# Patient Record
Sex: Female | Born: 1935 | Race: White | Hispanic: No | Marital: Single | Smoking: Never smoker
Health system: Southern US, Community
[De-identification: ages and names within clinical notes are randomized; demographics above are authoritative.]

## PROBLEM LIST (undated history)

## (undated) DIAGNOSIS — R7989 Other specified abnormal findings of blood chemistry: Secondary | ICD-10-CM

## (undated) DIAGNOSIS — W19XXXA Unspecified fall, initial encounter: Secondary | ICD-10-CM

## (undated) DIAGNOSIS — I10 Essential (primary) hypertension: Secondary | ICD-10-CM

## (undated) DIAGNOSIS — D649 Anemia, unspecified: Secondary | ICD-10-CM

## (undated) DIAGNOSIS — B029 Zoster without complications: Secondary | ICD-10-CM

## (undated) DIAGNOSIS — R279 Unspecified lack of coordination: Secondary | ICD-10-CM

## (undated) DIAGNOSIS — R296 Repeated falls: Secondary | ICD-10-CM

## (undated) DIAGNOSIS — Z933 Colostomy status: Secondary | ICD-10-CM

## (undated) DIAGNOSIS — I4891 Unspecified atrial fibrillation: Secondary | ICD-10-CM

## (undated) DIAGNOSIS — K439 Ventral hernia without obstruction or gangrene: Secondary | ICD-10-CM

## (undated) DIAGNOSIS — L0390 Cellulitis, unspecified: Secondary | ICD-10-CM

## (undated) DIAGNOSIS — M6281 Muscle weakness (generalized): Secondary | ICD-10-CM

## (undated) DIAGNOSIS — R1310 Dysphagia, unspecified: Secondary | ICD-10-CM

## (undated) HISTORY — DX: Zoster without complications: B02.9

## (undated) HISTORY — PX: TOTAL ABDOMINAL HYSTERECTOMY: SHX209

## (undated) HISTORY — PX: COLOSTOMY: SHX63

## (undated) HISTORY — DX: Colostomy status: Z93.3

---

## 1998-02-22 ENCOUNTER — Encounter: Payer: Self-pay | Admitting: Emergency Medicine

## 1998-02-22 ENCOUNTER — Emergency Department (HOSPITAL_COMMUNITY): Admission: EM | Admit: 1998-02-22 | Discharge: 1998-02-22 | Payer: Self-pay | Admitting: Emergency Medicine

## 2006-01-01 DIAGNOSIS — Z933 Colostomy status: Secondary | ICD-10-CM

## 2006-01-01 HISTORY — DX: Colostomy status: Z93.3

## 2006-08-30 ENCOUNTER — Ambulatory Visit: Payer: Self-pay | Admitting: Oncology

## 2006-08-30 ENCOUNTER — Inpatient Hospital Stay (HOSPITAL_COMMUNITY): Admission: EM | Admit: 2006-08-30 | Discharge: 2006-09-09 | Payer: Self-pay | Admitting: Emergency Medicine

## 2006-08-30 ENCOUNTER — Ambulatory Visit: Payer: Self-pay | Admitting: Surgery

## 2006-08-31 ENCOUNTER — Encounter (INDEPENDENT_AMBULATORY_CARE_PROVIDER_SITE_OTHER): Payer: Self-pay | Admitting: General Surgery

## 2006-09-13 ENCOUNTER — Ambulatory Visit: Payer: Self-pay | Admitting: Oncology

## 2008-03-04 ENCOUNTER — Emergency Department (HOSPITAL_COMMUNITY): Admission: EM | Admit: 2008-03-04 | Discharge: 2008-03-04 | Payer: Self-pay | Admitting: Emergency Medicine

## 2008-09-25 IMAGING — CR DG ABDOMEN ACUTE W/ 1V CHEST
3 series · 3 of 3 positions shown · non-contrast
Comparison: CT 08/29/06.

CLINICAL DATA: Abdominal pain for two days.
 ACUTE ABDOMINAL SERIES WITH CHEST ? 3 VIEWS ? 08/30/06:

[w chest pa]
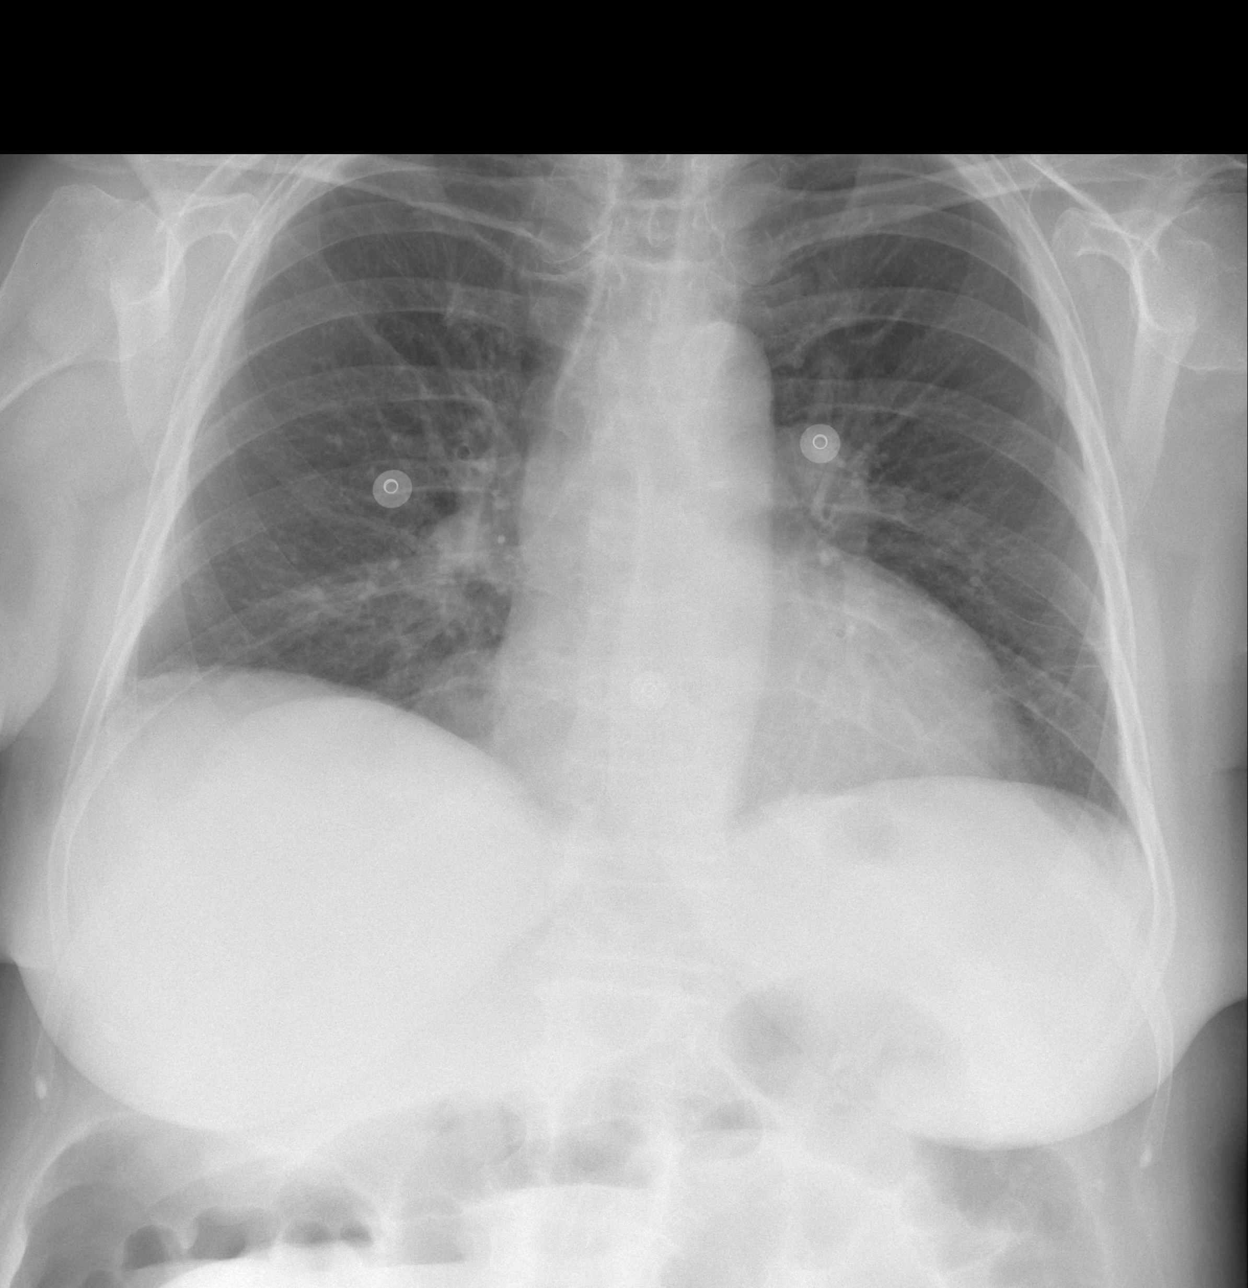

[w abdomen upright *]
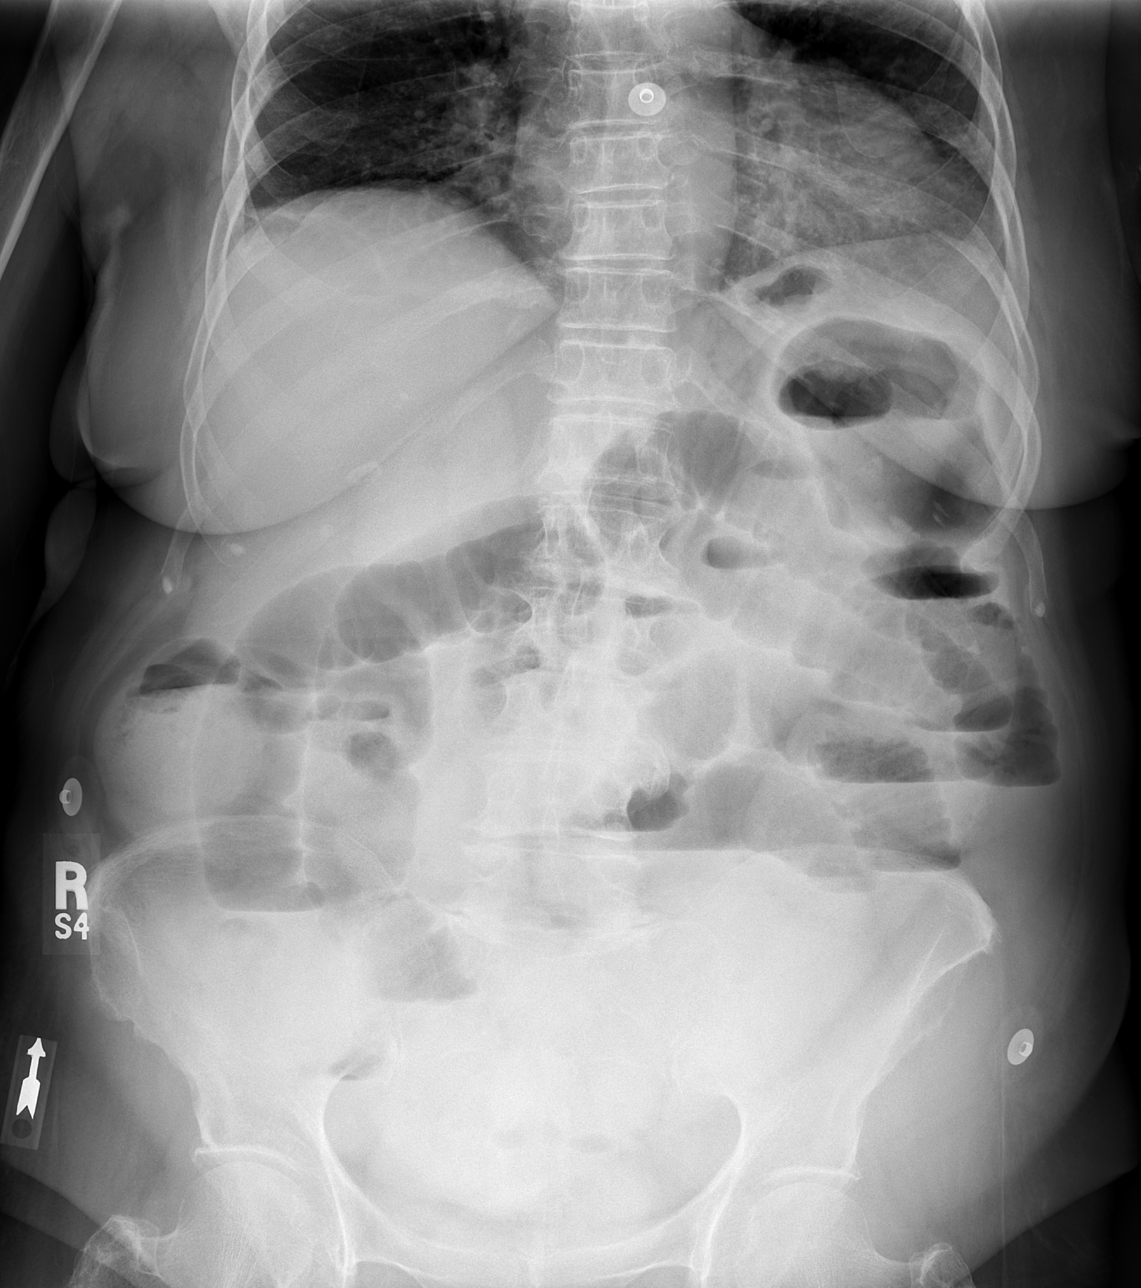

[t abdomen supine]
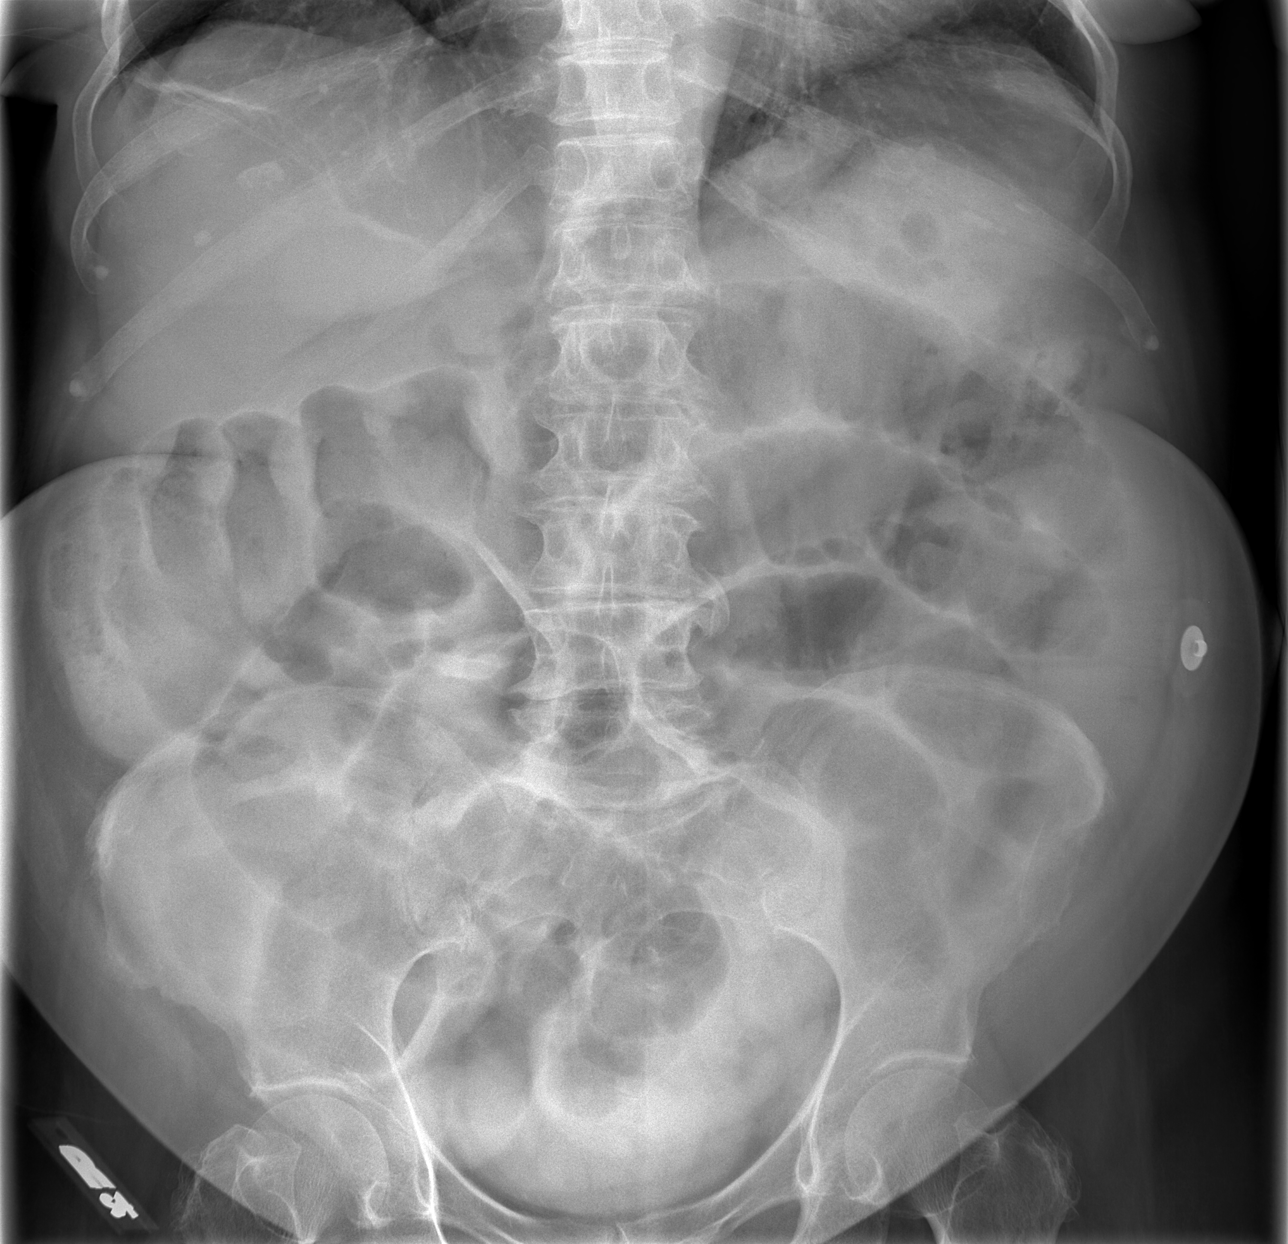

[3 of 3 positions shown; findings below may reference images not displayed]

FINDINGS: The cardiopericardial silhouette is within normal limits for size.   There is minimal bibasilar atelectasis, right worst than left.  Supine and upright views of the abdomen demonstrate multiple air-fluid levels within dilated large and small bowel compatible with a distal colonic obstruction seen on CT.
IMPRESSION: 1.  Distal colonic obstruction redemonstrated.
 2.  Low lung volumes with bibasilar atelectasis.

## 2008-09-26 IMAGING — CR DG BE SINGLE CONTRAST
4 series · 4 of 4 positions shown · non-contrast
Comparison: Radiographs 08/31/06 and earlier.

CLINICAL DATA: 77 year-old female with distal colonic obstruction, question of obstructing mass on prior CT.
WATER-SOLUBLE SINGLE-CONTRAST ENEMA:

[view not recorded (1 of 4)]
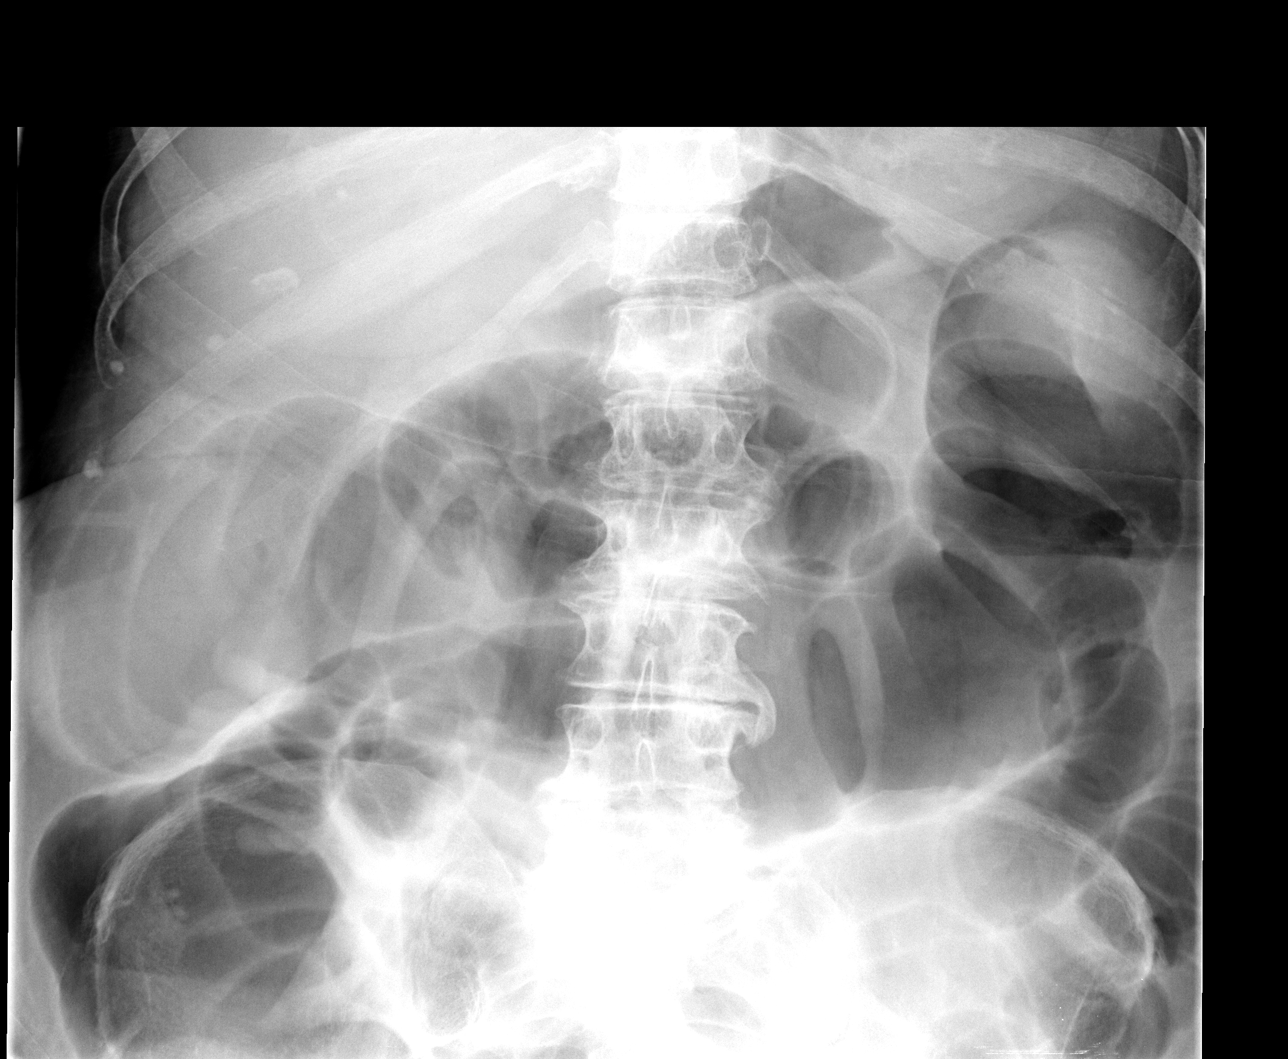

[view not recorded (2 of 4)]
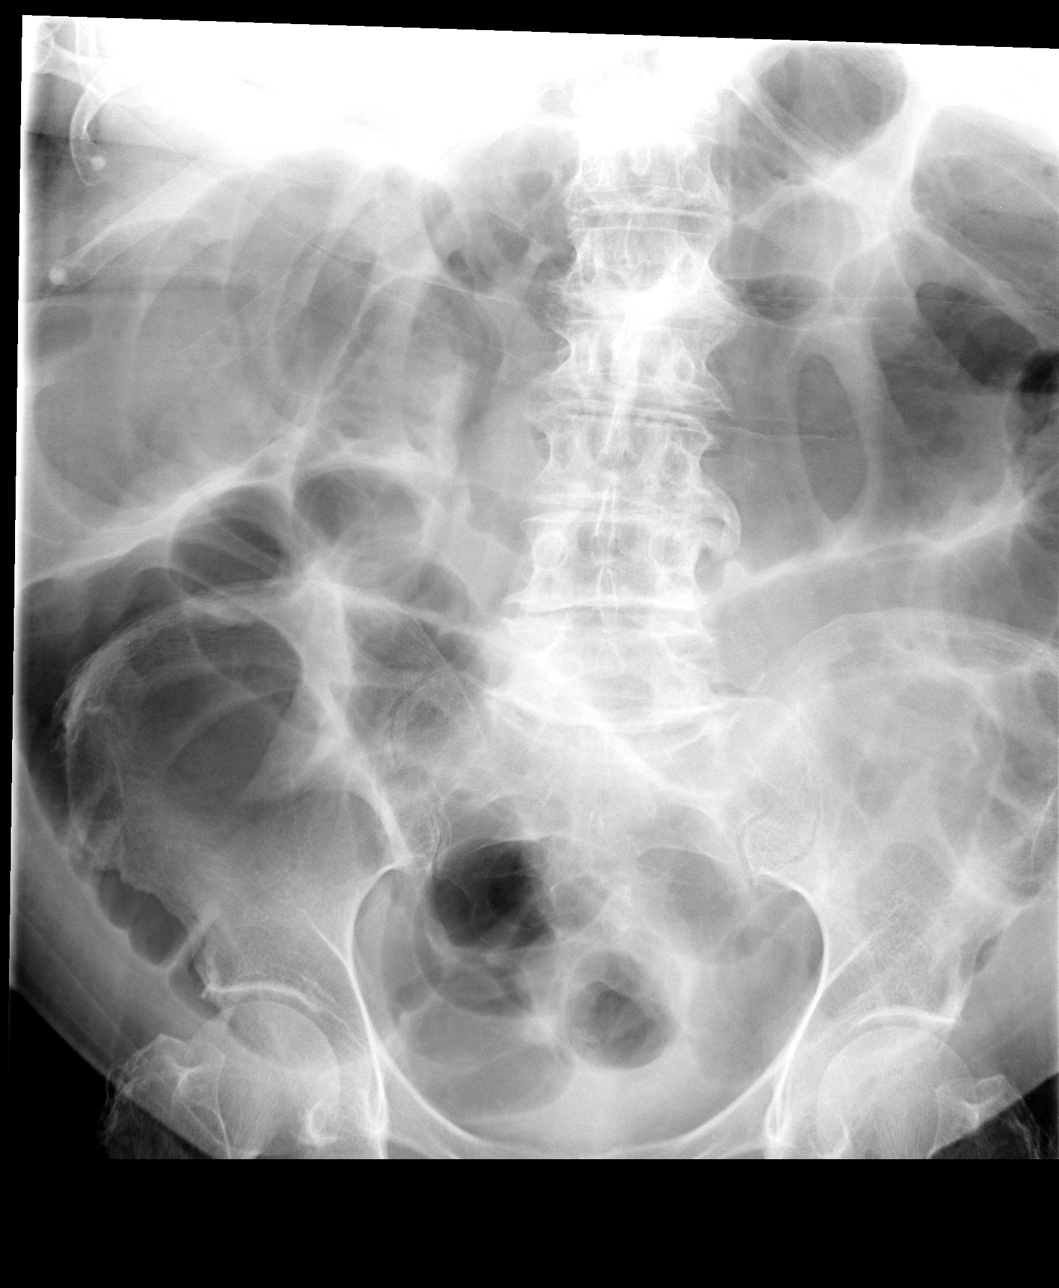

[view not recorded (3 of 4)]
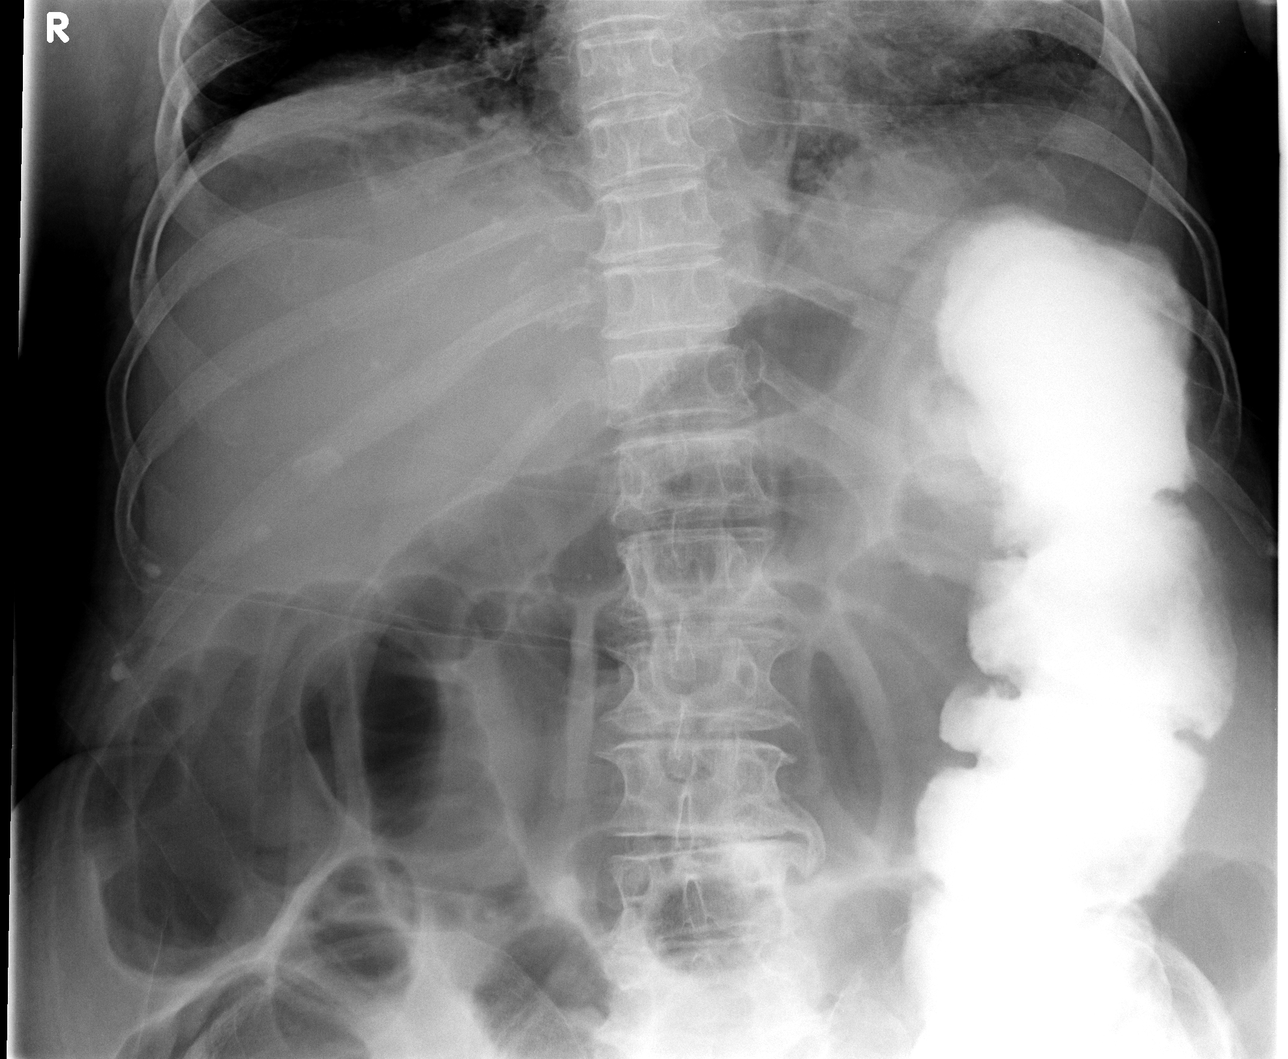

[view not recorded (4 of 4)]
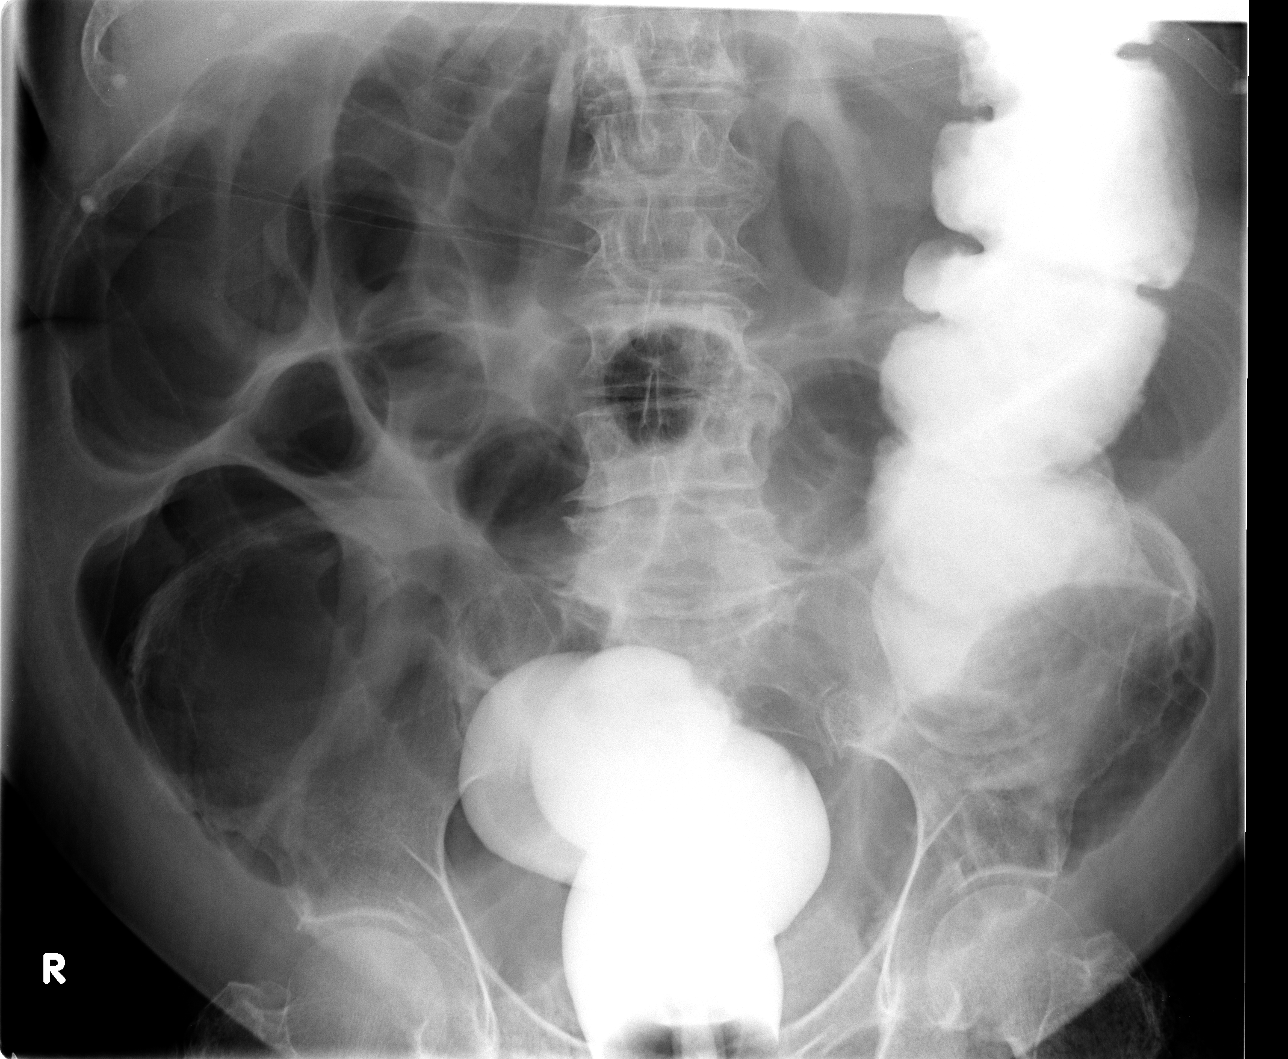

[4 of 4 positions shown; findings below may reference images not displayed]

CT abdomen and pelvis 08/29/06.
Technique/Findings:  The examination was explained to the patient.  Following unremarkable digital rectal examination, a flexible plastic catheter was inserted into the rectum and the balloon was inflated under fluoroscopic visualization.  Using gravity, water-soluble Gastrografin contrast was slowly administered to the colon during fluoroscopic and spot film evaluation.  
  The rectum was normal.  The distal sigmoid colon was normal.  At the junction of the sigmoid and descending colons overlying the left lower pelvis a radiographic string-sign was encountered in an area that was not distensible throughout the course of the exam.  Contrast was able to get past the lesion, and did normally distend the descending colon to the level of the splenic flexure.  Multiple fluoroscopic spot films were obtained of the area demonstrating shouldering of the adjacent descending and sigmoid segments and radiographic string-sign over a length of approximately 4cm.  This corresponds to the area of in-distensibility seen on the prior CT (series 2, image # 71).  At this point, the study was halted.  
  Several overhead films were taken and the contrast was drained from the colon.  Fluoroscopic postdrainage images were obtained showing normal drainage of the rectosigmoid segment.  There was contrast retained within the descending colon to the splenic flexure with slight drainage.
IMPRESSION: Findings compatible with apple-core lesion at the junctions of the descending and sigmoid colons with near complete obstruction along a 4cm segment correlating to prior CT findings.  Gm. Raion Lock and Hp Boyko were advised of the above by me at 2262 hours.

## 2010-01-22 ENCOUNTER — Encounter: Payer: Self-pay | Admitting: General Surgery

## 2010-04-13 LAB — DIFFERENTIAL
Basophils Absolute: 0 10*3/uL (ref 0.0–0.1)
Eosinophils Absolute: 0.1 10*3/uL (ref 0.0–0.7)
Lymphocytes Relative: 47 % — ABNORMAL HIGH (ref 12–46)
Monocytes Relative: 27 % — ABNORMAL HIGH (ref 3–12)
Neutrophils Relative %: 22 % — ABNORMAL LOW (ref 43–77)

## 2010-04-13 LAB — COMPREHENSIVE METABOLIC PANEL
Albumin: 3.8 g/dL (ref 3.5–5.2)
Alkaline Phosphatase: 69 U/L (ref 39–117)
BUN: 13 mg/dL (ref 6–23)
Chloride: 96 mEq/L (ref 96–112)
Creatinine, Ser: 0.67 mg/dL (ref 0.4–1.2)
Glucose, Bld: 98 mg/dL (ref 70–99)
Potassium: 3.7 mEq/L (ref 3.5–5.1)
Total Bilirubin: 1 mg/dL (ref 0.3–1.2)

## 2010-04-13 LAB — URINALYSIS, ROUTINE W REFLEX MICROSCOPIC
Bilirubin Urine: NEGATIVE
Ketones, ur: 15 mg/dL — AB
Nitrite: NEGATIVE
Protein, ur: NEGATIVE mg/dL
Urobilinogen, UA: 0.2 mg/dL (ref 0.0–1.0)

## 2010-04-13 LAB — PATHOLOGIST SMEAR REVIEW

## 2010-04-13 LAB — CBC
HCT: 35.2 % — ABNORMAL LOW (ref 36.0–46.0)
Hemoglobin: 12.4 g/dL (ref 12.0–15.0)
MCV: 97.9 fL (ref 78.0–100.0)
Platelets: 188 10*3/uL (ref 150–400)
RDW: 13.2 % (ref 11.5–15.5)

## 2010-04-13 LAB — LIPASE, BLOOD: Lipase: 22 U/L (ref 11–59)

## 2010-05-16 NOTE — H&P (Signed)
[name], [name]             ACCOUNT NO.:  [id number]   MEDICAL RECORD NO.:  [id number]          PATIENT TYPE:  INP   LOCATION:  1401                         FACILITY:  [facility]   PHYSICIAN:  Hind I Elsaid, MD      DATE OF BIRTH:  07/04/1928   DATE OF ADMISSION:  08/29/2006  DATE OF DISCHARGE:                              HISTORY & PHYSICAL   PRIMARY CARE PHYSICIAN:  Unassigned.   CHIEF COMPLAINT:  Abdominal pain and bilateral lower extremity swelling.   HISTORY OF PRESENT ILLNESS:  This is a 77 year old pleasant, Caucasian  female.  Apparently, she has no primary care physician and she has never  seen a primary care physician for a medical check up or followup as she  does not like doctors and does not like being hospitalized.  Apparently,  the patient had some abscess on her face in March 2008 when the patient  was receiving Clindamycin p.o. for that, which was complicated by  diarrhea, which resolved after stopping the Clindamycin after the  resolution of the facial abscess.  The patient continued to complain of  right iliac abdominal pain for more than 6 months from now.  The pain  severe, around 8/10, radiating around to the umbilicus which was thought  related to Clindamycin.  The condition was associated with constipation.  The patient denies any blood per rectum.  The patient admitted the pain  is on and off and the pain is not related to food.  Last bowel movement  was 3 days ago.  Denies any nausea or vomiting.  She has had about a 16  pound weight loss over the last 3 months.  The patient denies any  history of colonoscopy or endoscopy in the past.  As I mentioned, the  patient denies any nausea or vomiting and she did not complain of any  abdominal pain today.  She noticed bilateral lower extremity swelling in  the last 2 days.  The patient also admitted she complained of abdominal  fullness and swelling as if she is pregnant.  The patient denies any  headache, denies  any shortness of breath, denies any chest pain, denies  any palpitation, denies any hematuria and denies any vomiting of blood,  denies any fever or cough.  She denies any numbness or weakness of her  extremities.   PAST MEDICAL HISTORY:  The patient admitted she was diagnosed with some  leukopenia which she has followed by herself.  No further medical  history known to the patient.   PAST SURGICAL HISTORY:  Hysterectomy and some small facial abscess which  was resolved after Clindamycin use.   ALLERGIES:  ERYTHROMYCIN and PENICILLIN.   MEDICATIONS:  None.   FAMILY HISTORY:  Mother died with stroke and father died with heart  attack and both had diabetes.   SOCIAL HISTORY:  She is single.  No children.  Lives by herself.  No  history of smoking and occasionally drinks beer but she is not a heavy  drinker.  Denies any drug abuse.   PHYSICAL EXAMINATION:  GENERAL:  A really pleasant female, not  in any  acute respiratory distress or shortness of breath.  VITAL SIGNS:  Temperature 97.3, blood pressure 127/52, pulse rate 112,  respiratory rate 18, saturation 98% on room air.  HEENT:  Mildly pale, no jaundice.  Extraocular movements within normal.  Pupils equal, round, reactive to light and accommodation.  NECK:  No thyromegaly, no lymphadenopathy, no JVD.  LUNGS:  Normal regular breathing.  Equal air entry.  HEART:  S1 and S2, tachycardiac.  No other sounds.  ABDOMEN:  Distended.  Bowel sounds muffled.  Mild tenderness of the  right iliac area and I cannot appreciate any organomegaly at this time.  EXTREMITIES:  Bilateral lower edema.  NEURO:  Oriented x3.  There was no focal neurological finding.  RECTAL:  Stool was dark and guaiac positive.   LABORATORY DATA:  Sodium 132, chloride 4.7, carbon dioxide 28, glucose  121, BUN 20, creatinine 0.64, calcium 8.2, total protein 5.5, albumin  2.24, AST 14 and white blood cells 5, hemoglobin 9.4, hematocrit 27.2  with MCV 90 and MCHC 34.3,  platelets 494.  Urinalysis was negative  except for small ketones.  Urine RBCs were negative.  Abdominal x-ray  showed some dilated loops of the small bowel with air fluid level for  both of small bowel obstructions versus ileus.  CT scan which showed  colonic obstruction with concern for underlying lesion on the sigmoid  colon and large bowel diffuse colon  wall thickening.  No free air, 3 mm  right lung.   ASSESSMENT/PLAN:  1. Abdominal pain with above CT scan findings with main concern for      sigmoid colonic mass, possibility of colonic cancer.  We will admit      the patient.  Keep the patient NPO.  We will place an NG tube as      there is a possibility of large obstruction with complicated small      bowel obstruction secondary to the sigmoid colon mass.  We will      place on NG tube.  IV fluids.  We will consult gastroenterology      /surgicall consult to evaluate for possible colonoscopy and biopsy      of the sigmoid colon.  We will get carcinogenic embryonic antigen      and CA19 line.  We will consider CT of the chest and bone scan and      CT head after the report of the colonoscopy.  2. Anemia with guaiac positive stool most probably secondary to the      sigmoid colon mass.  There is no evidence of ischemic bowel at this      time.  We will monitor the hemoglobin and the patient may need a      blood transfusion during hospitalization.  DVT and rehab      prophylaxis.      Hind Bosie Helper, MD  Electronically Signed     HIE/MEDQ  D:  08/30/2006  T:  08/30/2006  Job:  [id number]

## 2010-05-16 NOTE — Op Note (Signed)
[name], [name]             ACCOUNT NO.:  [id number]   MEDICAL RECORD NO.:  [id number]          PATIENT TYPE:  INP   LOCATION:  1401                         FACILITY:  [facility]   PHYSICIAN:  Petra Kuba, M.D.    DATE OF BIRTH:  1928-08-31   DATE OF PROCEDURE:  08/31/2006  DATE OF DISCHARGE:                               OPERATIVE REPORT   PROCEDURE:  Colonoscopy.   INDICATION:  Abnormal CAT scan worrisome for mass versus colonic  obstruction of the sigmoid/descending junction.   Consent was signed after risks, benefits, methods, and options were  thoroughly discussed with the patient or any sedation on multiple  occasions.   MEDICINES USED:  Fentanyl 50 mcg, Versed 5 mg.   DESCRIPTION OF PROCEDURE:  Rectal inspection is pertinent for small  external hemorrhoids.  Digital exam was negative. The pediatric video  colonoscope was inserted and with lots of difficulty due to an unprepped  stool full with semisolid stool and liquid, we were able to advance to  about 50 cm.  We did try abdominal pressure and rolling her on her back  to try to advance further but we could not see in the distance.  We did  enter one dilated segment which air was suctioned from. Also on  insertion, occasional left-sided diverticula were seen and possibly we  even saw a small polyp; however, with the increased stool complete  visualization was difficult. No obvious mass was seen in the exam we  saw. Air and water were suctioned. Throughout the procedure, we washed  and suctioned 1500 mL but no definitive diagnosis could be made. After a  prolonged effort, we elected to withdraw.  Air was suctioned, scope  removed.  The patient tolerated the procedure well.  There was no  obvious immediate complication.   ENDOSCOPIC DIAGNOSES:  1. Internal and external hemorrhoids.  2. Occasional left-sided diverticula.  3. Questionably one small polyp seen in the sigmoid.  4. Otherwise increased __________  50 cm,  unable to wash and      suctioned, despite suctioning 1500 mL.   PLAN:  Will proceed with a Gastrografin enema and then pending those  findings either retry after a continual prep or possibly surgical  options. In the meantime, give her potassium and only ice chips,  possibly she will need an NG tube.           ______________________________  Petra Kuba, M.D.     MEM/MEDQ  D:  08/31/2006  T:  09/01/2006  Job:  [id number]   cc:   Alfonse Ras, MD  [address]  [place]  [place] [address]

## 2010-05-16 NOTE — Discharge Summary (Signed)
NAMEKRISTYNA, [name]             ACCOUNT NO.:  [id number]   MEDICAL RECORD NO.:  [id number]          PATIENT TYPE:  INP   LOCATION:  1401                         FACILITY:  [facility]   PHYSICIAN:  Lonia Blood, M.D.DATE OF BIRTH:  26-Feb-1928   DATE OF ADMISSION:  08/29/2006  DATE OF DISCHARGE:  09/09/2006                               DISCHARGE SUMMARY   PRIMARY CARE PHYSICIAN:  Unassigned.   DISCHARGE DIAGNOSES:  1. Pseudomyxoma peritonaei.      a.     Sigmoid colon location causing colon obstruction.      b.     Status post partial colectomy with ostomy.      c.     Outpatient Oncology follow up arranged.  2. Hyponatremia, resolved with p.o. intake.  3. Acute blood loss anemia - hemoglobin 8.3 at discharge.  4. Leukopenia - white count 1.8 to 2.2 at discharge - follow up with      Oncology.   ALLERGIES:  ERYTHROMYCIN AND PENICILLIN   DISCHARGE MEDICATIONS:  Vicodin 1-2 p.o. q.4 h p.r.n. pain.   FOLLOWUP:  1. The patient is to follow up with Dr. Claud Kelp in two weeks      for routine postop recheck.  2. The patient is to follow up with Dr. Arline Asp at the [facility]      in two weeks for ongoing medical care.   CONSULTATIONS:  1. Dr. Vida Rigger - GI.  2. General Surgery.  3. Dr. Johney Frame - Oncology.   PROCEDURES:  1. CT scan of the abdomen and pelvis  August 29, 2006. Distal colonic      obstruction, colon wall thickening with suspicious lesion in the      sigmoid colon.  No acute pelvic findings.  2. Colonoscopy August 31, 2006 - Significant retained stool and      occasional ticks, but otherwise, unrevealing.  3. Hartmann resection for obstructing sigmoid lesion with colostomy      August 31, 2006 - Dr. Claud Kelp.   DISCHARGE LABORATORIES:  Hemoglobin 8.3, white count 1.8, platelets 369.  Sodium 136, BUN 8, creatinine 0.7 on September 10, 2006.   Surgical pathology August 31, 2006.  Findings consistent with  pseudomyxoma peritonaei.   HOSPITAL COURSE:  [name] is a very pleasant 77 year old  female who has been remarkably healthy up to date.  She had no local  primary care physician.  She presented to the hospital on August 29, 2006, with complaints of abdominal pain and severe bilateral lower  extremity edema.  CT scan of the abdomen was obtained in the emergency  room and revealed evidence of a colonic obstruction, but no evidence of  a ruptured discus.  The patient was admitted to the acute units.  NG  tube was placed for decompression.  GI was consulted.  Attempt was made  to perform a colonoscopy, but in a nonprep state.  This was minimally  useful.  No suspicious lesions were able to be found.  As a result, a  barium enema was ordered.  This revealed apple-cord-type lesion at the  junction  of the descending and sigmoid colon with near complete  obstruction.  This was discussed with General Surgery who had also been  consulted on the case.  Dr. Claud Kelp then took the patient to the  operating room and performed a sigmoid colon resection/Hartmann  resection with a coloscopy.  In the postoperative period, the patient  improved nicely.  Surgical paths from the surgery suggested a  pseudomyxoma peritonaei.  Oncology was then consulted.  No additional  care was recommended by Oncology for the acute episode, but recurrence  of this lesion is felt to be virtually guaranteed, and therefore,  outpatient monitoring through the Oncology Center has been arranged by  Dr. Arline Asp.  Postoperative course was somewhat complicated by  hyponatremia and acute blood loss anemia.  The patient did not require  transfusion.  With volume resuscitation, her hyponatremia resolved.  The  patient was counseled as the appropriate care of her ostomy by wound  ostomy clinical nurse specialist.  By September 09, 2006, she was  medically stable.  Her hyponatremia had resolved.  Her hemoglobin was  relatively stable but will be  monitored in the outpatient setting.  Again, on September 09, 2006, both medically and surgical teams were in  agreement.  The patient was stable for discharge.  Home health, physical  therapy and occupational therapy was arranged, and the patient was clear  for discharge with follow up arrangements as noted above.      Lonia Blood, M.D.  Electronically Signed     JTM/MEDQ  D:  09/09/2006  T:  09/09/2006  Job:  [id number]

## 2010-05-16 NOTE — Consult Note (Signed)
[name], [name]             ACCOUNT NO.:  [id number]   MEDICAL RECORD NO.:  [id number]          PATIENT TYPE:  INP   LOCATION:  1401                         FACILITY:  [facility]   PHYSICIAN:  Alfonse Ras, MD   DATE OF BIRTH:  1928-10-11   DATE OF CONSULTATION:  08/30/2006  DATE OF DISCHARGE:                                 CONSULTATION   REFERRING PHYSICIAN:  [facility]ist.   REASON FOR CONSULTATION:  Abdominal pain, questionable constipation,  questionable colon mass.   HISTORY OF PRESENT ILLNESS:  The patient is a 77 year old very pleasant  and alert white female who presented after having some moderate  abdominal distention and crampy abdominal pain.  She was seen in the  emergency room.  Her last bowel movement was three days ago.  She was  worked up by CT scan, which showed significant constipation, thickening  of the descending colon, questionable mass in the distal descending  colon.  Patient has been seen by gastroenterology, Dr. Vida Rigger, who  has started a colon prep and planning for a colonoscopy, which I think  is completely appropriate.  She denies any acholic stools, dark urine,  fever, chills, but she does admit to maybe a 10 to 12 pound weight loss  in the last six months.   PAST MEDICAL HISTORY:  She denies any significant past medical history.   PHYSICAL EXAMINATION:  VITAL SIGNS:  Temperature is 97.3, heart rate is  94, blood pressure is 116/59.  HEENT:  Benign.  Normocephalic, atraumatic.  LUNGS:  Clear to auscultation and percussion x 2.  HEART:  Regular rate and rhythm, without murmurs, rubs or gallops.  ABDOMEN:  Slightly distended with positive bowel sounds and without any  abdominal tenderness.  EXTREMITIES:  Showed no clubbing, cyanosis or edema.   IMPRESSION:  Abdominal pain and cramping, significantly improved since  she has been hospitalized since last night.   PLAN:  For bowel prep and colonoscopy and I will follow up pending  those  results.      Alfonse Ras, MD  Electronically Signed     KRE/MEDQ  D:  08/30/2006  T:  08/31/2006  Job:  [id number]   cc:   [facility]   Petra Kuba, M.D.  Fax: [phone]

## 2010-05-16 NOTE — Op Note (Signed)
[name], CYPHERS             ACCOUNT NO.:  [id number]   MEDICAL RECORD NO.:  [id number]          PATIENT TYPE:  INP   LOCATION:  1401                         FACILITY:  [facility]   PHYSICIAN:  Angelia Mould. 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   POSTOPERATIVE DIAGNOSIS:  Large bowel obstruction secondary to  neoplastic mass of proximal sigmoid colon.   OPERATION PERFORMED:  Sigmoid colectomy with colostomy Gertie Gowda  resection).   SURGEON:  Dr. Claud Kelp.   FIRST ASSISTANT:  Dr. Cicero Duck.   OPERATIVE INDICATIONS:  This is a 77 year old white female who has had  some abdominal cramps and progressive abdominal distention over the past  week or so.  She was admitted yesterday by the [facility] and was seen by Dr. Vida Rigger and Dr. Baruch Merl.  CT scan  suggested a mass in the sigmoid colon and a large bowel obstruction.  Dr. Ewing Schlein attempted colonoscopy today and got to 50 cm but did not see  the obstructing point.  A Gastrografin enema done today showed an apple  core obstructing mass effect in the proximal sigmoid colon.  I examined  the patient thereafter and found that she was very distended and a  little bit tender diffusely and felt that she would need to undergo  emergent relief of her large bowel obstruction.   OPERATIVE FINDINGS:  There was a small neoplastic mass of the sigmoid  colon adherent to the left fallopian tube and ovary.  The fallopian tube  and ovary had to be resected with the specimen.  The left ureter was  identified and preserved but it is close to the rectal stump.  I would  consider putting ureteral stents in when she goes back for colostomy  closure.  The small bowel was diffusely distended.  The colon proximal  to  the obstruction was diffusely distended.  There was no evidence of  ischemia and no evidence of perforation.  The liver felt normal.  I did  not feel any enlarged lymph nodes in the mesentery.  The uterus was  surgically absent.   OPERATIVE TECHNIQUE:  Following the induction of general endotracheal  anesthesia, the patient's abdomen was prepped and draped in a sterile  fashion.  The patient was identified as correct patient and correct  procedure.  She had been given intravenous Cipro and Flagyl prior to the  procedure beginning.  Midline laparotomy was performed.  The abdomen was  entered and explored with findings as described above.  I eviscerated  the small bowel and mobilized the distal descending colon and sigmoid  colon.  I very carefully identified the left ureter coursing over the  left iliac vessels and heading toward the bladder.  I took the  dissection down over the pelvic brim where the mass was mobile but  tethered to the round ligament and left adnexa.  I resected the left  tube and  ovary, dividing structures with LigaSure device and some 2-0  silk ties.  I was then able to fully mobilize the malignant-appearing  mass up into the wound.  I made a window in the mesentery of the  proximal rectum, and I was able to transect the rectum about 4 cm distal  to the tumor using a GIA stapling device.  The rectal stump was marked  with 2 Prolene sutures.  It was then tacked up into the presacral soft  tissue with some silk sutures to keep it from retracting.  I was then  able to take the resection from distal to proximal, dividing smaller  mesenteric vessels with the LigaSure device and larger vessels were tied  with 2-0 silk ties.  I divided the distal descending colon with the GIA  stapling device, probably a good 10-12 cm above the tumor, and removed  the specimen and sent it for routine histology.  I marked the distal  margin with a silk suture.   I then irrigated out the left  paracolic gutter and the pelvis.  A few  bleeding points were controlled with cautery but hemostasis was good.  I  then milked all of the small bowel content back up into the stomach  where it was evacuated through the nasogastric tube, and this helped  decompress her a fair amount.  The colon looked like it would go through  the abdominal wall quite nicely.  In the left abdominal wall, lateral  aspect of the rectus sheath, I made a circular incision excising skin  and subcutaneous fat.  I made a cruciate incision in the fascia and then  separated the rectus muscle without dividing it and divided the  posterior rectus sheath and dilated this where 2 fingers could easily go  through this.  Using a Babcock clamp, I drew the descending colon  through the colostomy opening being careful not to twist or turn this.  This came through quite nicely.   I then further irrigated the left paracolic gutter and the pelvis,  checked for bleeding.  I did not find any.  I placed a piece of Sepra  film on top of the rectal stump to prevent adhesions in the future.   Once again, I would consider placing ureteral stents before the  colostomy closure because the left ureter is fairly close to the rectal  stump.   After all of these maneuvers, I found that the fascia would come  together in the midline.  I placed the small bowel and omentum back to  their anatomic positions.  We had a small serosal tear of the ascending  colon which I closed with interrupted sutures of 2-0 silk.  Midline  fascia was then closed with a running suture of double-stranded No. 1  PDS.  The wound was irrigated with saline and the skin staples.   We then tacked the colostomy to the anterior rectus fascia with several  interrupted sutures of 3-0 Vicryl.  We then cut the staple line off and  placed a sucker down into the colostomy and evacuated a lot of air and a  little bit of stool which helped decompress it further.  The  colostomy  looked pink and had a fairly good blood supply.  The colostomy was  matured with about 10 interrupted sutures of 3-0 Vicryl.  I was able to  easily pass my finger down through colostomy below the fascia.  We  placed a colostomy bag on the colostomy  and a dry gauze bandage on the  midline incision.   The patient tolerated the procedure well and was taken to the recovery  room in stable condition.  Estimated blood loss was about 300-400 mL.  Complications were none.  Sponge and instrument counts were correct.      Angelia Mould. Derrell Lolling, M.D.  Electronically Signed     HMI/MEDQ  D:  08/31/2006  T:  09/01/2006  Job:  [id number]

## 2010-05-16 NOTE — Consult Note (Signed)
NAMEUDELL, BLASINGAME             ACCOUNT NO.:  [id number]   MEDICAL RECORD NO.:  [id number]          PATIENT TYPE:  INP   LOCATION:  1401                         FACILITY:  [facility]   PHYSICIAN:  Samul Dada, M.D.DATE OF BIRTH:  September 23, 1928   DATE OF CONSULTATION:  09/05/2006  DATE OF DISCHARGE:                                 CONSULTATION   HISTORY:  [name] is a 77 year old white female whom I am  asked to see in consultation by Dr. Michaelyn Barter from [facility]  regarding a recent diagnosis of pseudomyxoma peritonei.  The patient is  seen tonight at his request.  When I attempted to interview and examine  the patient, she insisted that I come back tomorrow.  A neighbor was  visiting with her and after the neighbor was asked to step outside, the  patient indicated to me that she did not want anyone to know that she  could even be considered to have cancer.  She was even afraid that the  neighbor would see that I was from the [facility].  Accordingly, this  note is being dictated from extensive review of the chart as I was not  able to interview or examined the patient this evening.   In summary, this lady apparently has been in generally good health and  has no primary physician.  It is stated in the admission note that she  does not like doctors and does not like being hospitalized, has never  seen a primary care physician for a medical checkup or followup.  She  apparently had an infection on her face in March 2008, was given  clindamycin and had some diarrhea.  The diarrhea resolved but the  patient apparently was having right lower abdominal pain for the past 6  months.   When the patient presented for admission on August 29, 2006, through the  emergency room, she was in severe pain, which was occurring  intermittently.  Her last bowel movement had been 3 days prior to  admission.  There had not been any nausea or vomiting.  She had lost  about 16 pounds  during the previous 3 months.  She had never had  colonoscopy.  The patient had noted some abdominal swelling.  She was  admitted to the hospital on August 29, 2006.  Her initial evaluation was  indicative of a distal colonic obstruction from the CT scan of the  abdomen and pelvis.  There was colonic wall thickening with suspicious  lesion in the sigmoid colon.  That seemed to be the site of the  obstruction.  There were no other significant findings.  CT scans were  done with IV contrast.  There was no obvious liver metastatic disease or  any suggestion of abdominal carcinomatosis.  The patient had an acute  abdominal series with a chest x-ray.  These x-rays confirmed the CT  findings.  Chest x-ray was negative.   The patient was evaluated by surgery and underwent surgery by Dr. Derrell Lolling  on August 31, 2006.  Prior to that, however, she had undergone attempt  at colonoscopy.  Dr. Ewing Schlein apparently got up to 50-cm but did not see  the obstructing point.  A Gastrografin enema showed an apple core  obstructing mass effect in the proximal sigmoid colon.  At surgery, Dr.  Derrell Lolling noted a small neoplastic mass in the sigmoid colon adherent to  the left fallopian tube and ovary.  The fallopian tube and ovary had to  be resected with the specimen.  The left ureter was very close to the  rectal stump.  The liver felt normal.  The small bowel was diffusely  distended as well as the colon proximal to the point of obstruction.  There was no evidence for any ischemia or perforation.  There were no  enlarged lymph nodes.  The uterus was surgically absent.  The patient  underwent sigmoid colectomy with colostomy.   The pathology report, [id number], interpreted by Dr. Hollice Espy,  indicates pseudomyxoma peritonei lesion was completely submitted for  microscopic examination.  There were mucin pools in the muscularis  propria and subserosal fatty tissue.  There were a few strips of flat  glandular  epithelium with no significant cytologic atypia floating in  the mucin pool.  The overlying colonic mucosa is unremarkable.  The  ovary and fallopian tube were unremarkable.  It was noted that most  cases of pseudomyxoma peritonei are caused by spread from an appendiceal  primary, however, there have been other cases reported in literature  from a variety of GI sites as well as fallopian tubes, urachus, lung,  and breast.   The patient is currently recovering from her surgery on the 30th and  therefore, today is post-op day #5.  I should note that CEA was less  than 0.5 and CA19-9 was less than 1.2.   PAST MEDICAL HISTORY:  1. She has had a hysterectomy.  2. Also some facial abscesses treated with clindamycin.  3. Apparently, the patient was told she had a low white count in the      past.   She was on no medicines at the time of admission.   She is allergic to:  1. ERYTHROMYCIN.  2. PENICILLIN.   FAMILY HISTORY:  Mother died of a stroke.  Father died with heart  disease and both had diabetes.  I do not know if there is any history of  cancer.   SOCIAL HISTORY:  Apparently, there is no history of cigarette smoking.  The patient occasionally drinks beer but stated that she was not a heavy  drinker.  There is no history of drug abuse.  She is single, has no  children, lives by herself.   REVIEW OF SYSTEMS:  Apparently, the patient denied any type of  neurologic problems, headache, shortness of breath, chest pain,  palpitations, urinary problems, fever, cough, numbness or weakness.  She  was having some swelling of her legs and a lot of swelling of her  bowels.   PHYSICAL EXAMINATION:  Recorded at the time of admission disclosed:  VITAL SIGNS:  Normal.  GENERAL:  The patient was pale without observation scleral icterus.  There was no adenopathy or thyroid enlargement.  LUNGS:  Clear.  CARDIAC:  Normal without murmur.  ABDOMEN:  Distended with muffled bowel sounds and some  tenderness in the  right lower quadrant but no palpable organomegaly.  EXTREMITIES:  The patient had bilateral edema of the lower extremities.  NEUROLOGIC:  There were no focal neurologic findings.  RECTAL:  Showed dark stool which was heme positive.   On admission  the patient's hemoglobin was 9.4, hematocrit 27.2, and  white count was 5.  Chemistries were notable for an albumin of 2.4 and  normal liver function tests.  BUN was 20, creatinine 0.6.   IMPRESSION AND PLAN:  This patient apparently had a discrete focus of  tumor with the histologic findings as described above.  Despite the  absence of cytologic atypia, this entity in fact is a very low grade  malignancy.  Clearly, this patient has demonstrated malignant potential.  I should note that the pathology report stated that the overlying  colonic mucosa was unremarkable, thus this is not felt to have arisen in  the site of the sigmoid colon.  Certainly with the history of right  lower abdominal pain that started about 6 months ago, it is certainly  suggestive that this patient may indeed have an appendiceal primary.  Unfortunately, it is also highly likely that this patient has widespread  microscopic involvement of her abdominal cavity and the propensity of  this entity to ultimately cause widespread intra-abdominal  carcinomatosis is virtually 100%, although this may be a somewhat  indolent process with regard to the period of time that may be  associated with this disease process.  Unfortunately, recurrence is  associated with massive ascites, cachexia, and bowel dysfunction or  frank bowel obstruction.   We will plan to check a CA-125.  After discharge, I would recommend that  we obtain a PET scan.  This patient may be a candidate, if she is  willing, for intra-abdominal chemotherapy.  Such treatment may be  obtainable through [facility].   Thank you for this most interesting consult.  I  will certainly be happy  to follow this patient and hope that she will be agreeable to followup  and recommendations.      Samul Dada, M.D.  Electronically Signed     DSM/MEDQ  D:  09/05/2006  T:  09/05/2006  Job:  [id number]   cc:   Michaelyn Barter, M.D.   Angelia Mould. Derrell Lolling, M.D.  [address]  [place]  [place] [address]   Petra Kuba, M.D.  Fax: [phone]

## 2010-05-16 NOTE — Consult Note (Signed)
NAMEHEIDE, [name]             ACCOUNT NO.:  [id number]   MEDICAL RECORD NO.:  [id number]          PATIENT TYPE:  INP   LOCATION:  1401                         FACILITY:  [facility]   PHYSICIAN:  Petra Kuba, M.D.    DATE OF BIRTH:  1928/07/19   DATE OF CONSULTATION:  08/30/2006  DATE OF DISCHARGE:                                 CONSULTATION   REASON FOR CONSULTATION:  The patient is seen at the request of the  [facility] team for abnormal CAT scan, abdominal pain, questionable  constipation versus mass.   HISTORY OF PRESENT ILLNESS:  The patient said she has had some GI issues  since she got clindamycin in February for some oral problems. She  initially had some diarrhea, but that seemed to resolve. She has only  had constipation 1 time. Did take laxatives a week or 2 ago but  otherwise has been going regularly until the last day or 2. She has not  seen any blood. She may have had a flex significant or a colonoscopy  lots of years ago. Does not sound like she was sedated, probably just a  flex sig. Nothing runs in her family from a GI standpoint.   PAST MEDICAL HISTORY:  Her past medical history is pertinent for a  hysterectomy only. No chronic medical problems.   FAMILY HISTORY:  Negative for any obvious GI problems.   SOCIAL HISTORY:  Lives alone. Does not smoke. Socially drinks.   ALLERGIES:  INCLUDE ERYTHROMYCIN AND PENICILLIN.   REVIEW OF SYSTEMS:  Pertinent for some hemorrhoidal issues but no upper  tract problems. She has not had much of an appetite and may have lost a  little bit of weight.   PHYSICAL EXAMINATION:  GENERAL APPEARANCE: No acute distress.  VITAL SIGNS: Stable, afebrile.  ABDOMEN: The abdomen is soft, nontender. Possibly I feel some firmness  in the right upper quadrant. CT reviewed. Questionable circumferential  mass versus colon edema from constipation. Some increased colon dilation  and small bowel dilation. No nodes or obvious metastatic  disease.   LABORATORY DATA:  Pertinent for a hemoglobin of 9.4 with an MCV of 90,  normal white count and platelet count. Chemistries pertinent for a  normal BUN, creatinine, albumin 2.4, lipase normal. Liver tests normal.   ASSESSMENT:  Abdominal pain, abnormal CAT scan; questionable  constipation versus mass.   PLAN:  The risks, benefits, methods of not only the prep but flex sig or  colonoscopy were discussed with the patient extensively and will proceed  in the a.m. after prep with further work up and plans pending those  findings.           ______________________________  Petra Kuba, M.D.     MEM/MEDQ  D:  08/30/2006  T:  08/31/2006  Job:  [id number]

## 2010-06-22 ENCOUNTER — Other Ambulatory Visit (INDEPENDENT_AMBULATORY_CARE_PROVIDER_SITE_OTHER): Payer: Self-pay | Admitting: Surgery

## 2010-06-22 DIAGNOSIS — K469 Unspecified abdominal hernia without obstruction or gangrene: Secondary | ICD-10-CM

## 2010-06-29 ENCOUNTER — Ambulatory Visit
Admission: RE | Admit: 2010-06-29 | Discharge: 2010-06-29 | Disposition: A | Discharge status: Home / Self Care | Payer: Medicare Other | Source: Ambulatory Visit | Attending: Surgery | Admitting: Surgery

## 2010-06-29 DIAGNOSIS — K469 Unspecified abdominal hernia without obstruction or gangrene: Secondary | ICD-10-CM

## 2010-06-29 MED ORDER — IOHEXOL 300 MG/ML  SOLN
100.0000 mL | Freq: Once | INTRAMUSCULAR | Status: AC | PRN
  Administered 2010-06-29: 100 mL via INTRAVENOUS

## 2010-07-19 ENCOUNTER — Encounter (INDEPENDENT_AMBULATORY_CARE_PROVIDER_SITE_OTHER): Payer: Self-pay | Admitting: Surgery

## 2010-07-28 ENCOUNTER — Ambulatory Visit (INDEPENDENT_AMBULATORY_CARE_PROVIDER_SITE_OTHER): Payer: Medicare Other | Admitting: Surgery

## 2010-07-28 VITALS — Wt 157.8 lb

## 2010-07-28 DIAGNOSIS — B029 Zoster without complications: Secondary | ICD-10-CM

## 2010-07-28 DIAGNOSIS — K439 Ventral hernia without obstruction or gangrene: Secondary | ICD-10-CM

## 2010-07-28 DIAGNOSIS — Z933 Colostomy status: Secondary | ICD-10-CM

## 2010-07-28 NOTE — Progress Notes (Addendum)
[name] is a 77 YO WF with no PCP.  She is accompanied by her neighbor, [name].  In August 2008, she presented with a colonic obstruction.  At that time, she was seen by Drs. [name] and [name].  On 10/29/2006, she had a sigmoid colectomy with a Hartmann's pouch by Dr. Derrell Norris.  At first the colon lesion was thought to be a pseudomyxoma peritonei, but further review at [facility] thought this was diverticulitis.  The patient has remained angry that she was one time told she had cancer, but now she does not.  She states she "fired" Dr. Derrell Norris, then saw Dr. Colin Norris until she left, then saw Dr. Freida Norris until she left.  She saw me for the first time 05/26/2010.  She has had increasing difficulty with a weak abdominal wall and abdominal wall hernias.  She also has had Shingles. This limits her ability to wear an abdominal binder. She tried Vatlrex from March 2011 to October 2011, but it was too expensive for her to purchase.  Also it did not work very well. She is bitter about this.  She is not interested in reversing her colostomy.  For now, she refuses to think about it.  She has had two friends who had colostomies reversed and ended up with chronic diarrhea.  She is somewhat difficult to talk to.  Past Medical History  Diagnosis Date  . S/P colostomy 2008  . Shingles    PHYSICAL EXAM: Lungs:  Clear and symmetric. Heart:  RRR. Abdomen:  Large hernia defect.  Not incarcerated.  She basically has failure of her whole abdominal wall.  Her colostomy is functioning well through all of this.  DATA REVIEWED: CT scan 06/29/2010:  1.  Large ventral hernia and adjacent parastomal hernia a contained long segment of nonobstructed small bowel. 2.  Left lower quadrant colostomy.   Original Report Authenticated By: [name], M.D.  ASSESSMENT AND PLAN: 1.  Large ventral/peristomal hernia  This is a difficult problem in a difficult patient who  I don't think understands how hard this would be to fix and keep fixed.  I spent 45 minutes with Ms. [name] and Ms. [name] (her neighbor).  I walked them through the CT scan and its findings.  I talked about the options of not operating on the hernia vs. different surgical option.   I think to fix this large hernia,  the colostomy should be reversed or resited.  [name] expressed her concerns about getting diarrhea.  I discussed the concern of a leak from the bowel and those consequences. The risks of any surgery at her age are significant, though she feels young.  Risks include bleeding, infection, failure of any hernia repair, leak from any bowel anastomosis, and recurrence of the hernia.   I think she would be best served at a place that does a lot of complicated hernia repairs.  I gave her Dr. Karie Schwalbe. Norris's name and phone number for a consultation.  She is going to make an appointment to see him.  I will try to assist her in any way I can.  I gave her a copy of her CT scan report.  She should get a CD of the CT scan to take to Dr. Anda Norris.  2.  History of diverticulitis. 3.  Shingles involving much of her right side.  This has precluded her form wearing an abdominal binder.  [Signed subscription for ostomy supplies.  DN  02/29/2012] 

## 2010-07-28 NOTE — Patient Instructions (Addendum)
Obtain evaluation from Dr. Virgilio Frees [address] [facility], [place], [place].) - [phone].  Obtain disk copy of CT scan from [facility] - [phone].

## 2010-09-11 ENCOUNTER — Other Ambulatory Visit: Payer: Self-pay | Admitting: Family Medicine

## 2010-09-11 DIAGNOSIS — E049 Nontoxic goiter, unspecified: Secondary | ICD-10-CM

## 2010-09-15 ENCOUNTER — Ambulatory Visit
Admission: RE | Admit: 2010-09-15 | Discharge: 2010-09-15 | Disposition: A | Discharge status: Home / Self Care | Payer: Medicare Other | Source: Ambulatory Visit | Attending: Family Medicine | Admitting: Family Medicine

## 2010-09-15 DIAGNOSIS — E049 Nontoxic goiter, unspecified: Secondary | ICD-10-CM

## 2010-10-13 LAB — URINALYSIS, ROUTINE W REFLEX MICROSCOPIC
Glucose, UA: NEGATIVE
Specific Gravity, Urine: 1.028
Urobilinogen, UA: 1
pH: 6

## 2010-10-13 LAB — CBC
HCT: 24.2 — ABNORMAL LOW
HCT: 26.5 — ABNORMAL LOW
HCT: 27.9 — ABNORMAL LOW
HCT: 28.2 — ABNORMAL LOW
HCT: 21.5 — ABNORMAL LOW
HCT: 27.2 — ABNORMAL LOW
Hemoglobin: 8.3 — ABNORMAL LOW
Hemoglobin: 9.4 — ABNORMAL LOW
Hemoglobin: 9.4 — ABNORMAL LOW
Hemoglobin: 9.5 — ABNORMAL LOW
Hemoglobin: 8.3 — ABNORMAL LOW
MCHC: 33.7
MCHC: 33.7
MCHC: 33.9
MCHC: 34.1
MCV: 89.7
MCV: 90.2
MCV: 90.7
MCV: 90.8
Platelets: 310
Platelets: 313
Platelets: 353
Platelets: 347
Platelets: 466 — ABNORMAL HIGH
Platelets: 494 — ABNORMAL HIGH
RBC: 2.69 — ABNORMAL LOW
RBC: 2.92 — ABNORMAL LOW
RBC: 3.01 — ABNORMAL LOW
RBC: 3.06 — ABNORMAL LOW
RBC: 3.1 — ABNORMAL LOW
RBC: 3.11 — ABNORMAL LOW
RBC: 2.37 — ABNORMAL LOW
RBC: 2.72 — ABNORMAL LOW
RBC: 2.85 — ABNORMAL LOW
RDW: 14.5 — ABNORMAL HIGH
RDW: 14.6 — ABNORMAL HIGH
RDW: 15.5 — ABNORMAL HIGH
RDW: 14.8 — ABNORMAL HIGH
RDW: 15.2 — ABNORMAL HIGH
WBC: 1.7 — ABNORMAL LOW
WBC: 1.8 — ABNORMAL LOW
WBC: 1.9 — ABNORMAL LOW
WBC: 2 — ABNORMAL LOW
WBC: 3.7 — ABNORMAL LOW

## 2010-10-13 LAB — COMPREHENSIVE METABOLIC PANEL
ALT: 12
AST: 12
AST: 14
Albumin: 1.8 — ABNORMAL LOW
Albumin: 2.4 — ABNORMAL LOW
Alkaline Phosphatase: 76
Alkaline Phosphatase: 63
BUN: 4 — ABNORMAL LOW
BUN: 20
CO2: 26
CO2: 27
Chloride: 98
Chloride: 98
Creatinine, Ser: 0.7
GFR calc Af Amer: >60
GFR calc Af Amer: >60
GFR calc non Af Amer: >60
GFR calc non Af Amer: >60
Potassium: 4.2
Potassium: 4.7
Sodium: 133 — ABNORMAL LOW
Total Bilirubin: 0.7
Total Bilirubin: 0.7
Total Protein: 5.5 — ABNORMAL LOW

## 2010-10-13 LAB — BASIC METABOLIC PANEL
BUN: 1 — ABNORMAL LOW
BUN: 2 — ABNORMAL LOW
BUN: 6
BUN: 8
BUN: 8
CO2: 27
CO2: 28
CO2: 30
CO2: 27
CO2: 27
Calcium: 7.8 — ABNORMAL LOW
Calcium: 7.9 — ABNORMAL LOW
Calcium: 8 — ABNORMAL LOW
Calcium: 8 — ABNORMAL LOW
Calcium: 8.2 — ABNORMAL LOW
Calcium: 8.1 — ABNORMAL LOW
Chloride: 95 — ABNORMAL LOW
Chloride: 98
Creatinine, Ser: 0.51
Creatinine, Ser: 0.59
Creatinine, Ser: 0.6
Creatinine, Ser: 0.64
Creatinine, Ser: 0.6
GFR calc Af Amer: >60
GFR calc Af Amer: >60
GFR calc Af Amer: >60
GFR calc Af Amer: >60
GFR calc Af Amer: >60
GFR calc non Af Amer: >60
GFR calc non Af Amer: >60
GFR calc non Af Amer: >60
GFR calc non Af Amer: >60
GFR calc non Af Amer: >60
Glucose, Bld: 105 — ABNORMAL HIGH
Glucose, Bld: 128 — ABNORMAL HIGH
Glucose, Bld: 148 — ABNORMAL HIGH
Potassium: 3.8
Potassium: 4.7
Potassium: 5.1
Potassium: 4.5
Sodium: 133 — ABNORMAL LOW
Sodium: 133 — ABNORMAL LOW
Sodium: 136

## 2010-10-13 LAB — CROSSMATCH
ABO/RH(D): O POS
Antibody Screen: NEGATIVE

## 2010-10-13 LAB — URINE MICROSCOPIC-ADD ON

## 2010-10-13 LAB — DIFFERENTIAL
Basophils Relative: 0
Eosinophils Relative: 1
Lymphocytes Relative: 19
Monocytes Absolute: 1.1 — ABNORMAL HIGH
Neutrophils Relative %: 58

## 2010-10-13 LAB — CEA: CEA: 0.7

## 2010-10-13 LAB — CANCER ANTIGEN 19-9: CA 19-9: <1.2 — ABNORMAL LOW (ref <35.0)

## 2010-10-13 LAB — POTASSIUM: Potassium: 3.8

## 2010-10-13 LAB — MAGNESIUM: Magnesium: 1.8

## 2010-10-13 LAB — B-NATRIURETIC PEPTIDE (CONVERTED LAB): Pro B Natriuretic peptide (BNP): <30

## 2010-10-13 LAB — PHOSPHORUS: Phosphorus: 3.4

## 2014-08-02 DIAGNOSIS — R7989 Other specified abnormal findings of blood chemistry: Secondary | ICD-10-CM

## 2014-08-02 DIAGNOSIS — L0390 Cellulitis, unspecified: Secondary | ICD-10-CM

## 2014-08-02 HISTORY — DX: Other specified abnormal findings of blood chemistry: R79.89

## 2014-08-02 HISTORY — DX: Cellulitis, unspecified: L03.90

## 2014-08-06 ENCOUNTER — Encounter (HOSPITAL_COMMUNITY): Payer: Self-pay | Admitting: Emergency Medicine

## 2014-08-06 ENCOUNTER — Inpatient Hospital Stay (HOSPITAL_COMMUNITY): Payer: Medicare Other

## 2014-08-06 ENCOUNTER — Emergency Department (HOSPITAL_COMMUNITY): Payer: Medicare Other

## 2014-08-06 ENCOUNTER — Inpatient Hospital Stay (HOSPITAL_COMMUNITY)
Admission: EM | Admit: 2014-08-06 | Discharge: 2014-08-09 | DRG: 184 | Disposition: A | Discharge status: Skilled Nursing Facility | Payer: Medicare Other | Attending: Internal Medicine | Admitting: Internal Medicine

## 2014-08-06 DIAGNOSIS — I878 Other specified disorders of veins: Secondary | ICD-10-CM | POA: Diagnosis present

## 2014-08-06 DIAGNOSIS — Z933 Colostomy status: Secondary | ICD-10-CM

## 2014-08-06 DIAGNOSIS — S2231XA Fracture of one rib, right side, initial encounter for closed fracture: Secondary | ICD-10-CM

## 2014-08-06 DIAGNOSIS — D72819 Decreased white blood cell count, unspecified: Secondary | ICD-10-CM | POA: Diagnosis present

## 2014-08-06 DIAGNOSIS — Z8249 Family history of ischemic heart disease and other diseases of the circulatory system: Secondary | ICD-10-CM

## 2014-08-06 DIAGNOSIS — S2241XA Multiple fractures of ribs, right side, initial encounter for closed fracture: Secondary | ICD-10-CM | POA: Diagnosis not present

## 2014-08-06 DIAGNOSIS — E872 Acidosis: Secondary | ICD-10-CM | POA: Diagnosis present

## 2014-08-06 DIAGNOSIS — R079 Chest pain, unspecified: Secondary | ICD-10-CM | POA: Diagnosis not present

## 2014-08-06 DIAGNOSIS — Y92009 Unspecified place in unspecified non-institutional (private) residence as the place of occurrence of the external cause: Secondary | ICD-10-CM | POA: Diagnosis not present

## 2014-08-06 DIAGNOSIS — L0390 Cellulitis, unspecified: Secondary | ICD-10-CM

## 2014-08-06 DIAGNOSIS — Z880 Allergy status to penicillin: Secondary | ICD-10-CM | POA: Diagnosis not present

## 2014-08-06 DIAGNOSIS — N179 Acute kidney failure, unspecified: Secondary | ICD-10-CM | POA: Diagnosis present

## 2014-08-06 DIAGNOSIS — S2249XA Multiple fractures of ribs, unspecified side, initial encounter for closed fracture: Secondary | ICD-10-CM | POA: Insufficient documentation

## 2014-08-06 DIAGNOSIS — Z79899 Other long term (current) drug therapy: Secondary | ICD-10-CM

## 2014-08-06 DIAGNOSIS — E860 Dehydration: Secondary | ICD-10-CM | POA: Diagnosis present

## 2014-08-06 DIAGNOSIS — W010XXA Fall on same level from slipping, tripping and stumbling without subsequent striking against object, initial encounter: Secondary | ICD-10-CM | POA: Diagnosis present

## 2014-08-06 DIAGNOSIS — L03115 Cellulitis of right lower limb: Secondary | ICD-10-CM | POA: Diagnosis present

## 2014-08-06 DIAGNOSIS — S2239XA Fracture of one rib, unspecified side, initial encounter for closed fracture: Secondary | ICD-10-CM | POA: Diagnosis present

## 2014-08-06 DIAGNOSIS — D649 Anemia, unspecified: Secondary | ICD-10-CM | POA: Diagnosis present

## 2014-08-06 DIAGNOSIS — K439 Ventral hernia without obstruction or gangrene: Secondary | ICD-10-CM | POA: Diagnosis present

## 2014-08-06 DIAGNOSIS — L03116 Cellulitis of left lower limb: Secondary | ICD-10-CM | POA: Diagnosis present

## 2014-08-06 DIAGNOSIS — Z881 Allergy status to other antibiotic agents status: Secondary | ICD-10-CM | POA: Diagnosis not present

## 2014-08-06 LAB — COMPREHENSIVE METABOLIC PANEL
ALBUMIN: 2.8 g/dL — AB (ref 3.5–5.0)
ALT: 23 U/L (ref 14–54)
ALT: 24 U/L (ref 14–54)
AST: 27 U/L (ref 15–41)
AST: 45 U/L — ABNORMAL HIGH (ref 15–41)
Albumin: 2.5 g/dL — ABNORMAL LOW (ref 3.5–5.0)
Alkaline Phosphatase: 84 U/L (ref 38–126)
Alkaline Phosphatase: 75 U/L (ref 38–126)
Anion gap: 9 (ref 5–15)
Anion gap: 9 (ref 5–15)
BUN: 34 mg/dL — AB (ref 6–20)
BUN: 28 mg/dL — ABNORMAL HIGH (ref 6–20)
CHLORIDE: 107 mmol/L (ref 101–111)
CO2: 20 mmol/L — ABNORMAL LOW (ref 22–32)
CO2: 22 mmol/L (ref 22–32)
Calcium: 8.4 mg/dL — ABNORMAL LOW (ref 8.9–10.3)
Calcium: 8 mg/dL — ABNORMAL LOW (ref 8.9–10.3)
Chloride: 108 mmol/L (ref 101–111)
Creatinine, Ser: 1.15 mg/dL — ABNORMAL HIGH (ref 0.44–1.00)
Creatinine, Ser: 0.85 mg/dL (ref 0.44–1.00)
GFR calc Af Amer: 50 mL/min — ABNORMAL LOW (ref >60)
GFR calc Af Amer: >60 mL/min (ref >60)
GFR, EST NON AFRICAN AMERICAN: 43 mL/min — AB (ref >60)
GLUCOSE: 139 mg/dL — AB (ref 65–99)
Glucose, Bld: 124 mg/dL — ABNORMAL HIGH (ref 65–99)
Potassium: 4.7 mmol/L (ref 3.5–5.1)
Potassium: 4.9 mmol/L (ref 3.5–5.1)
SODIUM: 138 mmol/L (ref 135–145)
Sodium: 137 mmol/L (ref 135–145)
TOTAL PROTEIN: 6.1 g/dL — AB (ref 6.5–8.1)
Total Bilirubin: 0.5 mg/dL (ref 0.3–1.2)
Total Bilirubin: 1 mg/dL (ref 0.3–1.2)
Total Protein: 5.7 g/dL — ABNORMAL LOW (ref 6.5–8.1)

## 2014-08-06 LAB — URINALYSIS, ROUTINE W REFLEX MICROSCOPIC
Bilirubin Urine: NEGATIVE
Glucose, UA: NEGATIVE mg/dL
HGB URINE DIPSTICK: NEGATIVE
Ketones, ur: NEGATIVE mg/dL
Leukocytes, UA: NEGATIVE
Nitrite: POSITIVE — AB
PH: 5 (ref 5.0–8.0)
Protein, ur: NEGATIVE mg/dL
Specific Gravity, Urine: 1.036 — ABNORMAL HIGH (ref 1.005–1.030)
UROBILINOGEN UA: 0.2 mg/dL (ref 0.0–1.0)

## 2014-08-06 LAB — CBC WITH DIFFERENTIAL/PLATELET
BASOS PCT: 0 % (ref 0–1)
Basophils Absolute: 0 10*3/uL (ref 0.0–0.1)
Basophils Absolute: 0 10*3/uL (ref 0.0–0.1)
Basophils Relative: 0 % (ref 0–1)
EOS ABS: 0.1 10*3/uL (ref 0.0–0.7)
EOS ABS: 0 10*3/uL (ref 0.0–0.7)
EOS PCT: 2 % (ref 0–5)
Eosinophils Relative: 1 % (ref 0–5)
HCT: 33.3 % — ABNORMAL LOW (ref 36.0–46.0)
HEMATOCRIT: 32.8 % — AB (ref 36.0–46.0)
HEMOGLOBIN: 10.8 g/dL — AB (ref 12.0–15.0)
Hemoglobin: 11.1 g/dL — ABNORMAL LOW (ref 12.0–15.0)
LYMPHS PCT: 11 % — AB (ref 12–46)
Lymphocytes Relative: 25 % (ref 12–46)
Lymphs Abs: 0.6 10*3/uL — ABNORMAL LOW (ref 0.7–4.0)
Lymphs Abs: 0.6 10*3/uL — ABNORMAL LOW (ref 0.7–4.0)
MCH: 32.5 pg (ref 26.0–34.0)
MCH: 32.8 pg (ref 26.0–34.0)
MCHC: 32.9 g/dL (ref 30.0–36.0)
MCHC: 33.3 g/dL (ref 30.0–36.0)
MCV: 98.8 fL (ref 78.0–100.0)
MCV: 98.5 fL (ref 78.0–100.0)
MONOS PCT: 18 % — AB (ref 3–12)
Monocytes Absolute: 1 10*3/uL (ref 0.1–1.0)
Monocytes Absolute: 0.9 10*3/uL (ref 0.1–1.0)
Monocytes Relative: 35 % — ABNORMAL HIGH (ref 3–12)
NEUTROS ABS: 3.7 10*3/uL (ref 1.7–7.7)
NEUTROS ABS: 1 10*3/uL — AB (ref 1.7–7.7)
Neutrophils Relative %: 69 % (ref 43–77)
Neutrophils Relative %: 39 % — ABNORMAL LOW (ref 43–77)
Platelets: 248 10*3/uL (ref 150–400)
Platelets: 211 10*3/uL (ref 150–400)
RBC: 3.32 MIL/uL — ABNORMAL LOW (ref 3.87–5.11)
RBC: 3.38 MIL/uL — ABNORMAL LOW (ref 3.87–5.11)
RDW: 12.6 % (ref 11.5–15.5)
RDW: 12.4 % (ref 11.5–15.5)
WBC Morphology: INCREASED
WBC: 5.4 10*3/uL (ref 4.0–10.5)
WBC: 2.6 10*3/uL — AB (ref 4.0–10.5)

## 2014-08-06 LAB — URINE MICROSCOPIC-ADD ON

## 2014-08-06 LAB — I-STAT CG4 LACTIC ACID, ED: Lactic Acid, Venous: 1.13 mmol/L (ref 0.5–2.0)

## 2014-08-06 LAB — TSH: TSH: 1.824 u[IU]/mL (ref 0.350–4.500)

## 2014-08-06 MED ORDER — VANCOMYCIN HCL IN DEXTROSE 750-5 MG/150ML-% IV SOLN
750.0000 mg | Freq: Two times a day (BID) | INTRAVENOUS | Status: DC
  Administered 2014-08-06 – 2014-08-08 (×4): 750 mg via INTRAVENOUS
  Filled 2014-08-06 (×4): qty 150

## 2014-08-06 MED ORDER — ONDANSETRON HCL 4 MG/2ML IJ SOLN: 4.0000 mg | Freq: Four times a day (QID) | INTRAMUSCULAR | Status: DC | PRN

## 2014-08-06 MED ORDER — DEXTROSE 5 % IV SOLN
1.0000 g | Freq: Three times a day (TID) | INTRAVENOUS | Status: DC
  Administered 2014-08-06 – 2014-08-08 (×5): 1 g via INTRAVENOUS
  Filled 2014-08-06 (×7): qty 1

## 2014-08-06 MED ORDER — SODIUM CHLORIDE 0.9 % IV SOLN
INTRAVENOUS | Status: AC
  Administered 2014-08-06 – 2014-08-07 (×2): via INTRAVENOUS

## 2014-08-06 MED ORDER — VITAMIN B-12 1000 MCG PO TABS
1000.0000 ug | ORAL_TABLET | Freq: Every day | ORAL | Status: DC
  Administered 2014-08-06 – 2014-08-09 (×4): 1000 ug via ORAL
  Filled 2014-08-06 (×4): qty 1

## 2014-08-06 MED ORDER — MORPHINE SULFATE 4 MG/ML IJ SOLN: 4.0000 mg | Freq: Once | INTRAMUSCULAR | Status: DC

## 2014-08-06 MED ORDER — CLINDAMYCIN PHOSPHATE 600 MG/50ML IV SOLN
600.0000 mg | Freq: Once | INTRAVENOUS | Status: AC
  Administered 2014-08-06: 600 mg via INTRAVENOUS
  Filled 2014-08-06: qty 50

## 2014-08-06 MED ORDER — KETOROLAC TROMETHAMINE 15 MG/ML IJ SOLN
15.0000 mg | Freq: Four times a day (QID) | INTRAMUSCULAR | Status: DC | PRN
  Administered 2014-08-09: 15 mg via INTRAVENOUS
  Filled 2014-08-06: qty 1

## 2014-08-06 MED ORDER — DEXTROSE 5 % IV SOLN
2.0000 g | Freq: Once | INTRAVENOUS | Status: AC
  Administered 2014-08-06: 2 g via INTRAVENOUS
  Filled 2014-08-06: qty 2

## 2014-08-06 MED ORDER — LIDOCAINE 5 % EX PTCH
1.0000 | MEDICATED_PATCH | CUTANEOUS | Status: DC
  Administered 2014-08-08: 1 via TRANSDERMAL
  Filled 2014-08-06 (×5): qty 1

## 2014-08-06 MED ORDER — ENOXAPARIN SODIUM 40 MG/0.4ML ~~LOC~~ SOLN
40.0000 mg | SUBCUTANEOUS | Status: DC
  Administered 2014-08-06 – 2014-08-09 (×4): 40 mg via SUBCUTANEOUS
  Filled 2014-08-06 (×4): qty 0.4

## 2014-08-06 MED ORDER — VANCOMYCIN HCL IN DEXTROSE 1-5 GM/200ML-% IV SOLN
1000.0000 mg | Freq: Once | INTRAVENOUS | Status: AC
  Administered 2014-08-06: 1000 mg via INTRAVENOUS
  Filled 2014-08-06: qty 200

## 2014-08-06 MED ORDER — ONDANSETRON HCL 4 MG PO TABS: 4.0000 mg | ORAL_TABLET | Freq: Four times a day (QID) | ORAL | Status: DC | PRN

## 2014-08-06 NOTE — ED Notes (Signed)
Pt from home (lives by self) c/o right sided flank pain from falling over walker. Pt has very edematous and oozing lower extremities with foul odor present. Per [facility] patients house was not in a very clean condition and hoarding was a problem. They report stack and stacks of papers through out the house and fleas present. They report only about a 3 foot walk way cleared. She reports that she only uses oregano as an antibiotic. Per [facility] patient was only able to stand and pivot.

## 2014-08-06 NOTE — ED Notes (Addendum)
Attempted to call report to 3W, no answer will try again.

## 2014-08-06 NOTE — ED Provider Notes (Signed)
CSN: [id number]     Arrival date & time 08/06/14  0329 History   This chart was scribed for Martha Severin, MD by [name], ED Scribe. This patient was seen in room WA15/WA15 and the patient's care was started 3:42 AM.   Chief Complaint  Patient presents with  . Fall   The history is provided by the patient. No language interpreter was used.    HPI Comments: [name] brought in by EMS from home is a 81 y.o. female without any pertinent past medical history who presents to the Emergency Department here after an unwitnessed fall. Pt states she lost her balance and tripped over her walker resulting in her falling to the ground at approximately 1:00 PM this morning. No head trauma or LOC. She now c/o constant, ongoing R sided flank pain and R sided chest wall tenderness. No OTC medications attempted prior to arrival. However, topical Icey Hot applied to areas with no relief. [name] also reports ongoing swelling, redness, and drainage to lower extremities bilaterally. She denies any previous evaluation for lower extremities. However, she has attempted to apply soda water and Oregano to legs with no success for improvement. No recent fever, chills, or shortness of breath. Last follow up with PCP in 2012. Pt with known allergies to Erythromycin and Penicillins.   Past Medical History  Diagnosis Date  . S/P colostomy 2008  . Shingles    History reviewed. No pertinent past surgical history. No family history on file. History  Substance Use Topics  . Smoking status: Unknown If Ever Smoked  . Smokeless tobacco: Not on file  . Alcohol Use: No   OB History    No data available     Review of Systems  Constitutional: Negative for fever and chills.  Respiratory: Negative for cough and shortness of breath.   Gastrointestinal: Negative for nausea, vomiting and abdominal pain.  Musculoskeletal: Positive for arthralgias.  Skin: Positive for color change.  Neurological: Negative for  weakness and light-headedness.  Psychiatric/Behavioral: Negative for confusion.  All other systems reviewed and are negative.     Allergies  Erythromycin and Penicillins  Home Medications   Prior to Admission medications   Medication Sig Start Date End Date Taking? Authorizing Provider  ALOE VERA PO Take 1 capsule by mouth daily.      Historical Provider, MD  co-enzyme Q-10 30 MG capsule Take 30 mg by mouth daily.      Historical Provider, MD  LYSINE PO Take 1 capsule by mouth daily.      Historical Provider, MD  Probiotic Product (PROBIOTIC FORMULA PO) Take 1 capsule by mouth daily.      Historical Provider, MD  TURMERIC CURCUMIN PO Take 1 capsule by mouth daily.      Historical Provider, MD    Triage Vitals: BP 143/44 mmHg  Pulse 87  Temp(Src) 98.7 F (37.1 C) (Oral)  Resp 14  SpO2 98%   Physical Exam  Constitutional: She is oriented to person, place, and time. She appears well-developed and well-nourished. No distress.  HENT:  Head: Normocephalic and atraumatic.  Nose: Nose normal.  Mouth/Throat: Oropharynx is clear and moist.  Eyes: Conjunctivae and EOM are normal. Pupils are equal, round, and reactive to light.  Neck: Normal range of motion. Neck supple. No JVD present. No tracheal deviation present. No thyromegaly present.  Cardiovascular: Normal rate, regular rhythm, normal heart sounds and intact distal pulses.  Exam reveals no gallop and no friction rub.  No murmur heard. Pulmonary/Chest: Effort normal and breath sounds normal. No stridor. No respiratory distress. She has no wheezes. She has no rales. She exhibits tenderness (pt with significant tenderness with palpation of right upper chest, both front and back.  No decreased breath sounds, no crepitus.).  Abdominal: Soft. Bowel sounds are normal. She exhibits no distension and no mass. There is no tenderness. There is no rebound and no guarding.  Musculoskeletal: Normal range of motion. She exhibits edema. She  exhibits no tenderness.  Lymphadenopathy:    She has no cervical adenopathy.  Neurological: She is alert and oriented to person, place, and time. She displays normal reflexes. She exhibits normal muscle tone. Coordination normal.  Skin: Skin is warm and dry. No rash noted. There is erythema. No pallor.  Pt with erythema over feet, legs to knees.  Chronic skin changes with hypertrophy of skin.  Multiple ulcerations, bullae, purulent drainage, warmth, induration.  Psychiatric: She has a normal mood and affect. Her behavior is normal. Judgment and thought content normal.  Nursing note and vitals reviewed.   ED Course  Procedures (including critical care time)  DIAGNOSTIC STUDIES: Oxygen Saturation is 98% on RA, Normal by my interpretation.    COORDINATION OF CARE: 3:52 AM- Will order DG ribs unilateral with chest R, CBC, CMP, I-stat CG4 lactic acid, blood culture, and urinalysis. Discussed treatment plan with pt at bedside and pt agreed to plan.     Labs Review Labs Reviewed  CBC WITH DIFFERENTIAL/PLATELET - Abnormal; Notable for the following:    RBC 3.32 (*)    Hemoglobin 10.8 (*)    HCT 32.8 (*)    Lymphocytes Relative 11 (*)    Monocytes Relative 18 (*)    Lymphs Abs 0.6 (*)    All other components within normal limits  COMPREHENSIVE METABOLIC PANEL - Abnormal; Notable for the following:    CO2 20 (*)    Glucose, Bld 139 (*)    BUN 34 (*)    Creatinine, Ser 1.15 (*)    Calcium 8.4 (*)    Total Protein 6.1 (*)    Albumin 2.8 (*)    GFR calc non Af Amer 43 (*)    GFR calc Af Amer 50 (*)    All other components within normal limits  CULTURE, BLOOD (ROUTINE X 2)  CULTURE, BLOOD (ROUTINE X 2)  URINALYSIS, ROUTINE W REFLEX MICROSCOPIC (NOT AT [facility])  I-STAT CG4 LACTIC ACID, ED    Imaging Review Dg Ribs Unilateral W/chest Right  08/06/2014   CLINICAL DATA:  Tripped and fall over walker.  EXAM: RIGHT RIBS AND CHEST - 3+ VIEW  COMPARISON:  None.  FINDINGS: Acute RIGHT posterior  mildly displaced fourth through eighth rib fractures. Patient is osteopenic.  No pneumothorax. Mildly elevated RIGHT hemidiaphragm with strandy densities. LEFT lung base atelectasis/scarring. Cardiomediastinal silhouette is nonsuspicious.  IMPRESSION: Acute mildly displaced RIGHT posterior fourth through eighth rib fractures. No pneumothorax. RIGHT lung base atelectasis or possible contusion.  LEFT lung base atelectasis/ scarring.   Electronically Signed   By: Awilda Metro M.D.   On: 08/06/2014 05:37     EKG Interpretation None      MDM   Final diagnoses:  Rib fractures, right, closed, initial encounter  Bilateral cellulitis of lower leg   I personally performed the services described in this documentation, which was scribed in my presence. The recorded information has been reviewed and is accurate.  81 yo female with fall at home.  Pt with right  upper ant/post chest pain.  More concerning, however is the state of her legs.  Pt with chronic venous stasis changes but has areas of ulcerated lesions with purulent drainage.  I am concerned for cellulitis given the warmth, induration and erythema.  Pt does not have a doctor, has been tx with oregano and soda water.  Per EMS she has poorly kept house.  Pt refusing home health for wound care, reports she is homebound and cannot go to wound center.  Will get labs, xray of chest.  Expect admission for cellulitis, and sw/cm help.  Martha Severin, MD 08/06/14 0600

## 2014-08-06 NOTE — ED Notes (Signed)
Main lab called to draw cultures. Difficult drawing labs only [id number] Sage StreMichaell Cowin40981191161096041610960435 Kingston DriveHedy Camara40981191865769629528Erie Noe09811914Theda Clark Med C74w5d0454098191478296440347236 526 341Marvel Pl045409811Eulah PonZOXWR'UMarguarite ArbouXBJYN'276-673-2114782956UnitedHea5444lKentuckythAuburn Bilberry409811914346962952161096073451610960I[facility] Health Saxony HospitSelect Specialty Hospital - Orlando NorthMarga Melnic7829562161096041610960VHQIO'1914782Allyson SabalRutled16109604AOZHY'11914782916109604[facility] Inc161096096295284295621306578Brunei DarussalaChild psychotherapis6t5[facility]16109604Orvilla Cornwall161096360664QIONG'16109161096045[address]09811914409817328 [place] DriWUJWJ'16109604086578464098Alfonso Ram54u045481191478Latanya PresseZOXWR510 302 4616109604086578461610960604540981Lesly Duke16109604414 [place] Dri829562182956213Armanda HeritaLindi A16d161096025ZOXWR'161GEXBM'Elon AlZOXWR'U16109604[facility]1324401(832)704-20[facility] Center- WilliamsburJasmine Decembe1610960Intel Corporati29Marla Ro161096048m429528417037429 Linden DriMichaell Cowin40981191161096041610960[address]Hedy Camara40981191865769629528Erie Noe09811914U.[employer]. [employer] [facility]37w5d0454098191478296440347(250)729-993Marvel Pl045409811Eulah PonZOXWR'UMarguarite ArbouXBJYN'956-720-3514782956UnitedHea9769lKentuckythAuburn Bilberry4098119144546962952161096056451610960[facility] Health ServicesFauquier HospitalMarga Melnic7829562161096041610960VHQIO'1914782Allyson SabalDarlingt16109604AOZHY'11914782916109604[facility]161096096295284295621306578Brunei DarussalaChild psychotherapis79t4[facility]16109604Orvilla Cornwall161096440424QIONG'161091610960459143 [address]098119144098150 [place] LaWUJWJ'16109604086578464098Alfonso Ram25u045481191478Latanya PresseZOXWR727-357-85216109604086578461610960604540981Lesly Duke16109604129 [place] Cou829562182956213Armanda HeritaLindi A107d161096025ZOXWR'161GEXBM'Elon AlZOXWR'U16109604[facility]1324401(343)291-53[facility] LPJasmine Decembe1610960Intel Corporati2Marla Ro161096060m4295284182344 Locust StreMichaell Cowin40981191161096041610960[address]Hedy Camara40981191865769629528Erie Noe09811914[facility] Infirma92w5d0454098191478296440347516090863Marvel Pl045409811Eulah PonZOXWR'UMarguarite ArbouXBJYN'(629) 370-1714782956UnitedHea4758lKentuckythAuburn Bilberry409811914246962952161096024451610960[facility] HilHoward County Medical CenteMarga Melnic7829562161096041610960VHQIO'1914782Allyson SabalBig La16109604AOZHY'11914782916109604Abrazo Central Campu161096096295284295621306578Brunei DarussalaChild psychotherapis34t5[facility]16109604Orvilla Cornwall16109655058QIONG'161091610960457[address]098119144098198 Tower StreWUJWJ'16109604086578464098Alfonso Ram76u045481191478Latanya PresseZOXWR(223)097-11016109604086578461610960604540981Lesly Duke1610960487 Arch Av829562182956213Armanda HeritaLindi A28d161096036ZOXWR'161GEXBM'Elon AlZOXWR'U16109604[facility]1324401(778) 591-56Corp[facility] Christ[facility] Specialty HospitaJasmine Decembe1610960Intel Corporati23Marla Ro161096069m4295284121340 New AvMichaell Cowin40981191161096041610960[address]Hedy Camara40981191865769629528Erie Noe09811914[facility] Cent65w5d0454098191478296440347(949)200-631Marvel Pl045409811Eulah PonZOXWR'UMarguarite ArbouXBJYN'934-218-1314782956UnitedHea2166lKentuckythAuburn Bilberry4098119145446962952161096054451610960Mildred Mitchell-Bateman HospitVa Medical Center - Kansas CitMarga Melnic7829562161096041610960VHQIO'1914782Allyson SabalNewber16109604AOZHY'11914782916109604Cs[facility] LL161096096295284295621306578Brunei DarussalaChild psychotherapis49t5[facility]16109604Orvilla Cornwall161096460324QIONG'161091610960457[address]09811914409817506 Princeton DriWUJWJ'16109604086578464098Alfonso Ram29u045481191478Latanya PresseZOXWR323-330-97216109604086578461610960604540981Lesly Duke1610960485 W. [place] D829562182956213Armanda HeritaLindi A70d161096026ZOXWR'161GEXBM'Elon AlZOXWR'U16109604[facility] Inc1324401905 313 14[facility] Memorial HospitaJasmine Decembe1610960Intel Corporati35Marla Ro161096057m42952841453968 53rd CouMichaell Cowin40981191161096041610960463 East Ocean RoadHedy Camara40981191865769629528Erie Noe09811914Plessen Eye L89w5d0454098191478296440347(858) 110-436Marvel Pl045409811Eulah PonZOXWR'UMarguarite ArbouXBJYN'234-359-6814782956UnitedHea6527lKentuckythAuburn Bilberry409811914646962952161096032451610960[facility] Memorial HospitRiverside Methodist HospitalMarga Melnic7829562161096041610960VHQIO'1914782Allyson SabalRent16109604AOZHY'11914782916109604[facility] Cente161096096295284295621306578Brunei DarussalaChild psychotherapis47t7[facility] LLC16109604Orvilla Cornwall161096200894QIONG'16109161096045[address]098119144098179 East State StreWUJWJ'16109604086578464098Alfonso Ram78u045481191478Latanya PresseZOXWR(516)276-07216109604086578461610960604540981Lesly Duke161096049796 [address] Stre829562182956213Armanda HeritaLindi A20d161096033ZOXWR'161GEXBM'Elon AlZOXWR'U16109604[facility]1324401(620) 231-26Medical City Of ArlingtonJasmine Decembe1610960Intel Corporati73Marla Ro161096034m42952841643412 Hamilton CouMichaell Cowin409811911610960416109604311 Yukon StreeHedy Camara40981191865769629528Erie Noe09811914[facility]32w5d0454098191478296440347(585)325-473Marvel Pl045409811Eulah PonZOXWR'UMarguarite ArbouXBJYN'(404)212-5214782956UnitedHea965lKentuckythAuburn Bilberry409811914346962952161096027451610960Kahi MohalAspen Mountain Medical CenteMarga Melnic7829562161096041610960VHQIO'1914782Allyson SabalCoventry La16109604AOZHY'11914782916109604[facility] [place] [facility] Dba [facility] LL161096096295284295621306578Brunei DarussalaChild psychotherapis63t4[facility] LLC16109604Orvilla Cornwall16109654012QIONG'161091610960458787 [address]09811914409817899 West RWUJWJ'16109604086578464098Alfonso Ram85u045481191478Latanya PresseZOXWR505-364-27616109604086578461610960604540981Lesly Duke161096047146 Forest S829562182956213Armanda HeritaLindi A80d161096039ZOXWR'161GEXBM'Elon AlZOXWR'U16109604[facility]1324401641-573-12South Pointe Surgical CenterJasmine Decembe1610960Intel Corporati62Marla Ro161096011m429528412339066 Baker SMichaell Cowin40981191161096041610960[address]Hedy Camara40981191865769629528Erie Noe09811914[facility]65w5d0454098191478296440347(234)372-683Marvel Pl045409811Eulah PonZOXWR'UMarguarite ArbouXBJYN'7188096814782956UnitedHea1356lKentuckythAuburn Bilberry409811914146962952161096084451610960Regional Behavioral Health CentNational Jewish HealthMarga Melnic7829562161096041610960VHQIO'1914782Allyson SabalLancast16109604AOZHY'11914782916109604[facility] Cente161096096295284295621306578Brunei DarussalaChild psychotherapis48t6[facility]16109604Orvilla Cornwall161096680224QIONG'161091610960457 [address]098119144098141 [place] CouWUJWJ'16109604086578464098Alfonso Ram59u045481191478Latanya PresseZOXWR(339) 836-75616109604086578461610960604540981Lesly Duke1610960496 Country S829562182956213Armanda HeritaLindi A39d161096077ZOXWR'161GEXBM'Elon AlZOXWR'U16109604[facility]1324401651-379-73Santa Ynez Valley Cottage HospitaJasmine Decembe1610960Intel Corporati72Marla Ro161096062m429528415937480 Baker SMichaell Cowin40981191161096041610960[address]Hedy Camara40981191865769629528Erie Noe09811914[facility]74w5d0454098191478296440347228-217-683Marvel Pl045409811Eulah PonZOXWR'UMarguarite ArbouXBJYN'778-159-0514782956UnitedHea7624lKentuckythAuburn Bilberry409811914746962952161096059451610960[facility] Regional Medical CentSkin Cancer And Reconstructive Surgery Center LLCMarga Melnic7829562161096041610960VHQIO'1914782Allyson SabalLazy Y16109604AOZHY'11914782916109604[facility]161096096295284295621306578Brunei DarussalaChild psychotherapis67t7[facility] [place]16109604Orvilla Cornwall161096510824QIONG'161091610960458293 [address]09811914409818 S. [place] RoWUJWJ'16109604086578464098Alfonso Ram50u045481191478Latanya PresseZOXWR260-115-90016109604086578461610960604540981Lesly Duke161096049543 Sage Av829562182956213Armanda HeritaLindi A22d161096076ZOXWR'161GEXBM'Elon AlZOXWR'U16109604[facility]1324401706-212-67[facility] LLCJasmine Decembe1610960Intel Corporati64Marla Ro161096016m42952841839857 [place] AvMichaell Cowin40981191161096041610960[address]Hedy Camara40981191865769629528Erie Noe09811914[facility]48w5d0454098191478296440347717-210-426Marvel Pl045409811Eulah PonZOXWR'UMarguarite ArbouXBJYN'848-632-7414782956UnitedHea6510lKentuckythAuburn Bilberry40981191446962952161096027451610960[facility] Rehabilitation HospitBayonet Point Surgery Center LtMarga Melnic7829562161096041610960VHQIO'1914782Allyson SabalDobs16109604AOZHY'11914782916109604[facility] LL161096096295284295621306578Brunei DarussalaChild psychotherapist6310[facility]16109604Orvilla Cornwall161096350824QIONG'161091610960458[address]098119144098177 Overlook AvenWUJWJ'16109604086578464098Alfonso Ram36u045481191478Latanya PresseZOXWR403-216-5016109604086578461610960604540981Lesly Duke1610960437 College Av829562182956213Armanda HeritaLindi A29d161096077ZOXWR'161GEXBM'Elon AlZOXWR'U16109604[facility]1324401(210)034-81[facility] CenterJasmine Decembe1610960Intel Corporati61Marla Ro161096096m42952841443194 [place] AvMichaell Cowin409811911610960416109604741 E. Vernon DriveHedy Camara40981191865769629528Erie Noe09811914[facility] L80w5d0454098191478296440347820-188-247Marvel Pl045409811Eulah PonZOXWR'UMarguarite ArbouXBJYN'(365)652-4914782956UnitedHea2822lKentuckythAuburn Bilberry409811914746962952161096028451610960[facility] HospitMedstar Franklin Square Medical CenteMarga Melnic7829562161096041610960VHQIO'1914782Allyson SabalGatew16109604AOZHY'11914782916109604[facility] Cente161096096295284295621306578Brunei DarussalaChild psychotherapis59t6[facility]16109604Orvilla Cornwall161096870224QIONG'16109161096045[address]098119144098113 [place] SWUJWJ'16109604086578464098Alfonso Ram69u045481191478Latanya PresseZOXWR725-693-81916109604086578461610960604540981Lesly Duke16109604903 [place] La829562182956213Armanda HeritaLindi A75d16109604ZOXWR'161GEXBM'Elon AlZOXWR'U16109604[facility]1324401419-209-53[facility] LLCJasmine Decembe1610960Intel Corporati23Marla Ro161096075m42952841623950 Summerhouse AvMichaell Cowin409811911610960416109604[address]Hedy Camara40981191865769629528Erie Noe09811914[facility] L49w5d0454098191478296440347(438)651-705Marvel Pl045409811Eulah PonZOXWR'UMarguarite ArbouXBJYN'779-601-6014782956UnitedHea5841lKentuckythAuburn Bilberry4098119146546962952161096047451610960Advanced Surgical Center LLHavasu Regional Medical CenteMarga Melnic7829562161096041610960VHQIO'1914782Allyson SabalSouth Pittsbu16109604AOZHY'11914782916109604[facility] Medical Cente161096096295284295621306578Brunei DarussalaChild psychotherapis19t4[facility]16109604Orvilla Cornwall161096490164QIONG'16109161096045[address]0981191440981116 [place] SWUJWJ'16109604086578464098Alfonso Ram25u045481191478Latanya PresseZOXWR262-116-69116109604086578461610960604540981Lesly Duke16109604358 [place] R829562182956213Armanda HeritaLindi A46d161096036ZOXWR'161GEXBM'Elon AlZOXWR'U16109604[facility]1324401916-349-79Ivinson Memorial HospitaJasmine Decembe1610960Intel Corporati71Marla Ro161096069m4295284117331 Maple AvenMichaell Cowin409811911610960416109604296C Market LaneHedy Camara40981191865769629528Erie Noe09811914Hosp [place] I79w5d0454098191478296440347619472972Marvel Pl045409811Eulah PonZOXWR'UMarguarite ArbouXBJYN'(206)258-4614782956UnitedHea897lKentuckythAuburn Bilberry409811914446962952161096054451610960[facility] Medical CentWestfields HospitalMarga Melnic7829562161096041610960VHQIO'1914782Allyson SabalEller16109604AOZHY'11914782916109604[facility]161096096295284295621306578Brunei DarussalaChild psychotherapis41t7[facility]16109604Orvilla Cornwall16109618038QIONG'16109161096045[address]0981191440981547 [place] SWUJWJ'16109604086578464098Alfonso Ram40u045481191478Latanya PresseZOXWR575-818-67516109604086578461610960604540981Lesly Duke1610960425 Overlook Av829562182956213Armanda HeritaLindi A22d161096067ZOXWR'161GEXBM'Elon AlZOXWR'U16109604[facility]1324401613-115-91[facility] Cape MaJasmine Decembe1610960Intel Corporati29Marla Ro161096016m429528416237272 [place] LaMichaell Cowin40981191161096041610960[address]Hedy Camara40981191865769629528Erie Noe09811914[facility]19w5d0454098191478296440347985-288-714Marvel Pl045409811Eulah PonZOXWR'UMarguarite ArbouXBJYN'(630)433-1014782956UnitedHea9753lKentuckythAuburn Bilberry409811914246962952161096025451610960[facility] Surgery Center LLStaten Island University Hospital - NorthMarga Melnic7829562161096041610960VHQIO'1914782Allyson SabalRo16109604AOZHY'11914782916109604[facility] Syste161096096295284295621306578Brunei DarussalaChild psychotherapis34t5[facility]16109604Orvilla Cornwall161096530574QIONG'161091610960[address]8119144098117 [place] AvWUJWJ'16109604086578464098Alfonso Ram60u045481191478Latanya PresseZOXWR(562)782-23916109604086578461610960604540981Lesly Duke16109604334 Brown Dri829562182956213Armanda HeritaLindi A73d161096080ZOXWR'161GEXBM'Elon AlZOXWR'U16109604[facility]1324401928-784-94[facility] For ChildrenJasmine Decembe1610960Intel Corporati34Marla Ro161096028m42952841193902 Peninsula CouMichaell Cowin4098119116109604161096047623 North Hillside StreeHedy Camara40981191865769629528Erie Noe09811914[facility] L46w5d0454098191478296440347989-721-378Marvel Pl045409811Eulah PonZOXWR'UMarguarite ArbouXBJYN'336-021-8414782956UnitedHea673lKentuckythAuburn Bilberry409811914746962952161096061451610960[facility] LLSt Elizabeth Youngstown HospitalMarga Melnic7829562161096041610960VHQIO'1914782Allyson SabalWoodbu16109604AOZHY'11914782916109604[facility] Hospita161096096295284295621306578Brunei DarussalaChild psychotherapis58t6[facility]16109604Orvilla Cornwall161096270554QIONG'1610916109604572 [address]098119144098183 E. Academy RoWUJWJ'16109604086578464098Alfonso Ram35u045481191478Latanya PresseZOXWR636-570-99916109604086578461610960604540981Lesly Duke16109604475 [place] Dri829562182956213Armanda HeritaLindi A16d161096065ZOXWR'161GEXBM'Elon AlZOXWR'U16109604[facility]1324401747-540-57L[facility] LLCJasmine Decembe1610960Intel Corporati86Marla Ro161096056m42952841143656 Ketch [place] SMichaell Cowin409811911610960416109604439 E. [address] StreeHedy Camara40981191865769629528Erie Noe09811914[facility] Stati50w5d0454098191478296440347408-849-395Marvel Pl045409811Eulah PonZOXWR'UMarguarite ArbouXBJYN'4704609214782956UnitedHea7360lKentuckythAuburn Bilberry409811914246962952161096058451610960Good Samaritan HospitWest Fall Surgery CenteMarga Melnic7829562161096041610960VHQIO'1914782Allyson SabalSouth Riv16109604AOZHY'11914782916109604Updegraff Vision Laser And Surgery Cente161096096295284295621306578Brunei DarussalaChild psychotherapis67t5[facility]16109604Orvilla Cornwall161096590624QIONG'16109161096045329 [address]098119144098130 School SWUJWJ'16109604086578464098Alfonso Ram43u045481191478Latanya PresseZOXWR416-131-71416109604086578461610960604540981Lesly Duke16109604743 [place] S829562182956213Armanda HeritaLindi A78d161096081ZOXWR'161GEXBM'Elon AlZOXWR'U16109604[facility]1324401586-652-50[facility]osp @ MerrimackJasmine Decembe1610960Intel Corporati56Marla Ro161096037m4295284172332 Central AvMichaell Cowin409811911610960416109604377 Manhattan LaneHedy Camara40981191865769629528Erie Noe09811914Surgecenter [facility] Palo Al35w5d0454098191478296440347503 204 710Mar[address]045409811Eulah PonZOXWR'UMarguarite ArbouXBJYN'306-609-8214782956UnitedHea461lKentuckythAuburn Bilberry409811914646962952161096068451610960[facility] Of QueensSain Francis Hospital Muskogee EasMarga Melnic7829562161096041610960VHQIO'1914782Allyson SabalRacetra16109604AOZHY'11914782916109604[facility] LL161096096295284295621306578Brunei DarussalaChild psychotherapis36t4[facility]16109604Orvilla Cornwall161096640314QIONG'16109161096045[address]8119144098174 Cherry DWUJWJ'16109604086578464098Alfonso Ram40u045481191478Latanya PresseZOXWR706 570 3816109604086578461610960604540981Lesly Duke16109604506 E. Summer S829562182956213Armanda HeritaLindi A15d161096059ZOXWR'161GEXBM'Elon AlZOXWR'U16109604[facility]1324401240 438 95Over[facility] Brooks Va Medical CenterJasmine Decembe1610960Intel Corporati64Marla Ro161096047m42952841373106 Ship[place] SMichaell Cowin40981191161096041610960[address]Hedy Camara40981191865769629528Erie Noe09811914[facility] [place] L4w5d0454098191478296440347(941)758-720Marvel Pl045409811Eulah PonZOXWR'UMarguarite ArbouXBJYN'218-227-6414782956UnitedHea8678lKentuckythAuburn Bilberry4098119141746962952161096035451610960[facility] Psychiatric Clinic And HospitBayfront Health Port CharlotteMarga Melnic7829562161096041610960VHQIO'1914782Allyson SabalSciotoda16109604AOZHY'11914782916109604[facility] LL161096096295284295621306578Brunei DarussalaChild psychotherapi77stAlomere Health16109604Orvilla Cornwall161096570774QIONG'161091610960459699 [address]09811914409819440 E. [place] DWUJWJ'16109604086578464098Alfonso Ram95u045481191478Latanya PresseZOXWR321-622-70116109604086578461610960604540981Lesly Duke16109604104 Heritage Cou829562182956213Armanda HeritaLindi A59d161096039ZOXWR'161GEXBM'Elon AlZOXWR'U16109604[facility]1324401740-552-71[facility] LLCJasmine Decembe1610960Intel Corporati23Marla Ro161096086m429528411133 [place] SMichaell Cowin409811911610960416[address]Hedy Camara40981191865769629528Erie Noe09811914[facility]2w5d0454098191478296440347636-201-486Marvel Pl045409811Eulah PonZOXWR'UMarguarite ArbouXBJYN'949-653-8314782956UnitedHea570lKentuckythAuburn Bilberry4098119147746962952161096031451610960[facility] AzlRussell HospitalMarga Melnic7829562161096041610960VHQIO'1914782Allyson SabalParsha16109604AOZHY'11914782916109604Nhpe LLC Dba [place] Endoscop161096096295284295621306578Brunei DarussalaChild psychotherapis32t5Advanced Colon Care Inc16109604Orvilla Cornwall161096590384QIONG'16109161096045414 [address]09811914409818383 [place] SWUJWJ'16109604086578464098Alfonso Ram52u045481191478Latanya PresseZOXWR45086929016109604086578461610960604540981Lesly Duke1610960436 Alton Cou829562182956213Armanda HeritaLindi A60d161096075ZOXWR'161GEXBM'Elon AlZOXWR'U16109604[facility]1324401680-632-34[facility] Partnership Dba Rush Surgicenter Ltd PartnershipJasmine Decembe1610960Intel Corporati77Marla Ro161096070m4295284132347 Elizabeth AvMichaell Cowin409811911610960416109604759 Logan CourHedy Camara40981191865769629528Erie Noe09811914[facility] Cent52w5d0454098191478296440347(580) 005-315Marvel Pl045409811Eulah PonZOXWR'UMarguarite ArbouXBJYN'(780)189-7714782956UnitedHea8619lKentuckythAuburn Bilberry4098119143546962952161096047451610960[facility] Dba [place] Endoscopy And SurgeryHansen Family HospitalMarga Melnic7829562161096041610960VHQIO'1914782Allyson SabalAubu16109604AOZHY'11914782916109604[facility] Recover161096096295284295621306578Brunei DarussalaChild psychotherapis3t8[facility]16109604Orvilla Cornwall161096710644QIONG'16109161096045[address]1914409817796 [address]. [place] StreWUJWJ'16109604086578464098Alfonso Ram13u045481191478Latanya PresseZOXWR(517) 505-66816109604086578461610960604540981Lesly Duke161096047823 Meadow S829562182956213Armanda HeritaLindi A74d161096032ZOXWR'161GEXBM'Elon AlZOXWR'U16109604[facility]1324401808 179 69Eye [facility] Northern NevadaJasmine Decembe1610960Intel Corporati52Marla Ro161096025m429528413837092 [place] CouMichaell Cowin4098119116109604161096048019 West Howard LaneHedy Camara40981191865769629528Erie Noe09811914Apple Surgery Cent28w5d0454098191478296440347605404214Marvel Pl045409811Eulah PonZOXWR'UMarguarite ArbouXBJYN'469-006-1614782956UnitedHea932lKentuckythAuburn Bilberry4098119143546962952161096074451610960City Hospital At White RockDevereux Treatment NetworkMarga Melnic7829562161096041610960VHQIO'1914782Allyson SabalMono Ci16109604AOZHY'11914782916109604[facility] Cente161096096295284295621306578Brunei DarussalaChild psychotherapis64t4[facility]16109604Orvilla Cornwall161096250374QIONG'16109161096045[address]0981191440981145 Oak StreWUJWJ'16109604086578464098Alfonso Ram74u045481191478Latanya PresseZOXWR443-686-95916109604086578461610960604540981Lesly Duke161096047155 [address]829562182956213Armanda HeritaLindi A5d16109606ZOXWR'161GEXBM'Elon AlZOXWR'U16109604[facility]1324401772 036 48[facility] LLCJasmine Decembe1610960Intel Corporati78Marla Ro161096060m429528413139509 [place] DMichaell [id number]Hedy Camara40981191865769629528Erie Noe09811914[facility]36w5d0454098191478296440347212-799-169Marvel Pl045409811Eulah PonZOXWR'UMarguarite ArbouXBJYN'9716152914782956UnitedHea8527lKentuckythAuburn Bilberry40981191410446962952161096081451610960[facility] CentTelecare [facility] PhfMarga [id number]'1914782Allyson SabalKaibi16109604AOZHY'11914782916109604[facility]161096096295284295621306578Brunei DarussalaChild psychotherapis28t5[facility]16109604Orvilla Cornwall161096340594QIONG'161091610960455[address]81191440981642 [address] DWUJWJ'16109604086578464098Alfonso Ram55u045481191478Latanya PresseZOXWR(239)443-5416109604086578461610960604540981Lesly Duke161096047398 [address] Cou829562182956213Armanda HeritaLindi A42d161096074ZOXWR'161GEXBM'Elon AlZOXWR'U16109604[facility]1324401516-208-91[facility] LLCJasmine Decembe1610960Intel Corporati24Marla Ro161096016m429528413839951 [place] AvMichaell Cowin40981191161096041610960410 [address] AvenueHedy Camara40981191865769629528Erie Noe09811914[facility]ori53w5d0454098191478296440347567-475-634Marvel Pl045409811Eulah PonZOXWR'UMarguarite ArbouXBJYN'971-150-6714782956UnitedHea3687lKentuckythAuburn Bilberry409811914246962952161096030451610960[facility], TheMarga [id number]'1914782Allyson SabalBloom16109604AOZHY'11914782916109604[facility]161096096295284295621306578Brunei DarussalaChild psychotherapis66t8[facility]16109604Orvilla Cornwall161096640224QIONG'16109161096045[address] Street0981191440981658 [place] SWUJWJ'16109604086578464098Alfonso Ram41u045481191478Latanya PresseZOXWR641-638-01616109604086578461610960604540981Lesly Duke1610960[address]29562182956213Armanda HeritaLindi A97d161096064ZOXWR'161GEXBM'Elon AlZOXWR'U16109604[facility]1324401([phone] SchooJasmine Decembe1610960Intel Corporati61Marla Ro161096063m42952841533424 [place] DriMichaell Cowin409811911610960416109604[address]Hedy Camara40981191865769629528Erie Noe09811914[facility]50w5d0454098191478296440347(863)072-232Marvel Pl045409811Eulah PonZOXWR'UMarguarite ArbouXBJYN'810 736 3014782956UnitedHea812lKentuckythAuburn Bilberry409811914746962952161096013451610960[facility] Region Med CentChildrens Healthcare Of Atlanta - EglestMarga Melnic7829562161096041610960VHQIO'1914782Allyson SabalChesapea16109604AOZHY'11914782916109604[facility]161096096295284295621306578Brunei DarussalaChild psychotherapis34t2[facility]16109604Orvilla Cornwall161096320894QIONG'16109161096045627 [address]09811914409819365 [place] SWUJWJ'16109604086578464098Alfonso Ram33u045481191478Latanya PresseZOXWR(806)328-45216109604086578461610960604540981Lesly Duke161096048094 [address] Circ829562182956213Armanda HeritaLindi A62d161096057ZOXWR'161GEXBM'Elon AlZOXWR'U16109604[facility]1324401(680) 454-82[facility] CenterJasmine Decembe1610960Intel Corporati64Marla Ro161096090m4295284161329 [place] StreMichaell Cowin4098119116109604[address]60[address]Hedy Camara40981191865769629528Erie Noe09811914[facility] L28w5d0454098191478296440347959 165 009[address]045409811Eulah PonZOXWR'UMarguarite ArbouXBJYN'(289)294-6514782956UnitedHea1140lKentuckythAuburn Bilberry409811914746962952161096094451610960[facility] + SpinPalms West HospitalMarga Melnic7829562161096041610960VHQIO'1914782Allyson SabalVio16109604AOZHY'11914782916109604[facility] Cente161096096295284295621306578Brunei DarussalaChild psychotherapis28t2[facility]16109604Orvilla Cornwall161096710714QIONG'1610916109604528 [address]09811914409819029 Longfellow DriWUJWJ'16109604086578464098Alfonso Ram24u045481191478Latanya PresseZOXWR(413)361-27116109604086578461610960604540981Lesly Duke1610960473 Vernon La829562182956213Armanda HeritaLindi A24d16109608ZOXWR'161GEXBM'Elon AlZOXWR'U16109604[facility]1324401747-815-02Es[facility] Surgical LLCJasmine Decembe1610960Intel Corporati61Marla Ro161096042m4295284153392 Carpenter RoMichaell Cowin409811911610960416109604[address]Hedy Camara40981191865769629528Erie Noe09811914[facility] Medical Co34w5d0454098191478296440347249-309-506Marvel Pl045409811Eulah PonZOXWR'UMarguarite ArbouXBJYN'(352)041-2014782956UnitedHea941lKentuckythAuburn Bilberry4098119145546962952161096031451610960Centennial Peaks HospitGifford Medical CenteMarga Melnic7829562161096041610960VHQIO'1914782Allyson SabalWintervil16109604AOZHY'11914782916109604[facility] LL161096096295284295621306578Brunei DarussalaChild psychotherapis1t7[facility]16109604Orvilla Cornwall161096720494QIONG'1610916109604563 [address]09811914409819376 [place] AvWUJWJ'16109604086578464098Alfonso Ram61u045481191478Latanya PresseZOXWR843-736-63516109604086578461610960604540981Lesly Duke16109604296 [place] Aven829562182956213Armanda HeritaLindi A19d161096043ZOXWR'161GEXBM'Elon AlZOXWR'U16109604[facility]1324401([phone] Hep[facility] PcJasmine Decembe1610960Intel Corporati66Marla Ro16109601m4295284110739443 Princess AvMichaell Cowin409811911610960416109604940 Windsor RoadHedy Camara40981191865769629528Erie Noe09811914[facility]61w5d0454098191478296440347(226)133-795Marvel Pl045409811Eulah PonZOXWR'UMarguarite ArbouXBJYN'830-748-7614782956UnitedHea5311lKentuckythAuburn Bilberry409811914546962952161096027451610960[facility] GreenOregon Trail Eye Surgery CenteMarga Melnic7829562161096041610960VHQIO'1914782Allyson SabalRocky Boy's Agen16109604AOZHY'11914782916109604Urosurgical Center Of [facility] Nort161096096295284295621306578Brunei DarussalaChild psychotherapis24t6[facility] LLC16109604Orvilla Cornwall16109660724QIONG'1610916109604530 [address]09811914409818196 River SWUJWJ'16109604086578464098Alfonso Ram80u045481191478Latanya PresseZOXWR66905315216109604086578461610960604540981Lesly Duke1610960448 [place] S829562182956213Armanda HeritaLindi A52d161096074ZOXWR'161GEXBM'Elon AlZOXWR'U16109604[facility]1324401(803) 544-99B[facility] Surgery CenterJasmine Decembe1610960Intel Corporati61Marla Ro161096055m4295284113184 Windsor StreMichaell Cowin409811911610960416109604706 [address]Hedy Camara40981191865769629528Erie Noe09811914Al[facility] Rose Surgery Cent74w5d0454098191478296440347(270) 168-191Marvel Pl045409811Eulah PonZOXWR'UMarguarite ArbouXBJYN'3527859714782956UnitedHea6739lKentuckythAuburn Bilberry409811914846962952161096029451610960Ent [facility] LLNorth Caddo Medical CenteMarga Melnic7829562161096041610960VHQIO'1914782Allyson SabalJohnsonbu16109604AOZHY'11914782916109604[facility] Endoscop161096096295284295621306578Brunei DarussalaChild psychotherapi89stPhysicians Eye Surgery Center Inc16109604Orvilla Cornwall161096610634QIONG'16109161096045969 [address]0981191440981803 [place] CouWUJWJ'16109604086578464098Alfonso Ram83u045481191478Latanya PresseZOXWR(806) 127-38816109604086578461610960604540981Lesly Duke16109604228 [place] Aven829562182956213Armanda HeritaLindi A11d161096052ZOXWR'161GEXBM'Elon AlZOXWR'U16109604[facility]1324401318-206-27[facility] HospitaJasmine Decembe1610960Intel Corporati52Marla Ro161096059m42952841237734 Lyme DMichaell Cowin4098119116109604161096047491 West Lawrence RoadHedy Camara40981191865769629528Erie Noe09811914[facility] Cent23w5d0454098191478296440347562-340-858Marvel Pl045409811Eulah PonZOXWR'UMarguarite ArbouXBJYN'332-388-4214782956UnitedHea6980lKentuckythAuburn Bilberry40981191446962952161096065451610960Wills Memorial HospitBeaumont Hospital Grosse PointeMarga Melnic7829562161096041610960VHQIO'1914782Allyson SabalWaldr16109604AOZHY'11914782916109604St. [facility] Patients Medical Cente161096096295284295621306578Brunei DarussalaChild psychotherapis78t3[facility] LLC16109604Orvilla Cornwall161096110204QIONG'1610916109604578 [address]098119144098183 Columbia CircWUJWJ'16109604086578464098Alfonso Ram31u045481191478Latanya PresseZOXWR307 360 7616109604086578461610960604540981Lesly Duke16109604197 Carriage R829562182956213Armanda HeritaLindi A67d161096073ZOXWR'161GEXBM'Elon AlZOXWR'U16109604[facility] [phone] ClinicJasmine Decembe1610960Intel Corporati2Marla Ro161096049m4295284154362 Pil[place] DriMichaell Cowin409811911610960416109604708 [address]Hedy Camara40981191865769629528Erie Noe09811914[facility] L26w5d0454098191478296440347902-534-002Marvel Pl045409811Eulah PonZOXWR'UMarguarite ArbouXBJYN'315-882-3914782956UnitedHea5380lKentuckythAuburn Bilberry4098119146546962952161096012451610960Outpatient Surgical Services LtdJohn C Fremont Healthcare DistricMarga Melnic7829562161096041610960VHQIO'1914782Allyson SabalSeconsett Isla16109604AOZHY'11914782916109604Northridge Facial Plastic Surgery Medical Grou161096096295284295621306578Brunei DarussalaChild psychotherapis41t7[facility]16109604Orvilla Cornwall161096670634QIONG'16109161096045626 [address]09811914409818686 [place] AvWUJWJ'16109604086578464098Alfonso Ram3u045481191478Latanya PresseZOXWR83001855016109604086578461610960604540981Lesly Duke1610960470 Old Primrose S829562182956213Armanda HeritaLindi A33d161096067ZOXWR'161GEXBM'Elon AlZOXWR'U16109604Kindred Hospital PhiladeLPhia - Havertown132440161755376Carrollton SpringJasmine Decembe1610960Intel Corporati98Marla Ro16109609m42952841993414 North Church StreMichaell Cowin4098119116109604161096047675 Railroad StreeHedy Camara40981191865769629528Erie Noe09811914[address] Same Day Surgery 6w5d0454098191478296440347(581) 224-267Marvel Pl045409811Eulah PonZOXWR'UMarguarite ArbouXBJYN'564-357-1914782956UnitedHea385lKentuckythAuburn Bilberry4098119143469629521610960714516109602201 Blaine Mn Multi Dba [facility] Surgery CentSurgery Center Of Silverdale LLCMarga Melnic7829562161096041610960VHQIO'1914782Allyson SabalCam16109604AOZHY'11914782916109604[facility] Clini161096096295284295621306578Brunei DarussalaChild psychotherapis7t5[facility].16109604Orvilla Cornwall161096750244QIONG'161091610960[address]098119144098145 [place] StreWUJWJ'16109604086578464098Alfonso Ram79u045481191478Latanya PresseZOXWR309 479 71816109604086578461610960604540981Lesly Duke161096047 Anderson D829562182956213Armanda HeritaLindi A54d161096020ZOXWR'161GEXBM'Elon AlZOXWR'U16109604[facility]1324401332-748-14[facility] HospitaJasmine Decembe1610960Intel Corporati51Marla Ro161096013m429528417239957 [address] AvMichaell Cowin409811911610960416109604[address]Hedy Camara40981191865769629528Erie Noe09811914Upmc Sh[facility]ide-23w5d0454098191478296440347(351) 489-517Marvel Pl045409811Eulah PonZOXWR'UMarguarite ArbouXBJYN'417-495-7414782956UnitedHea4966lKentuckythAuburn Bilberry4098119142746962952161096021451610960[facility] PMarga Melnic7829562161096041610960VHQIO'1914782Allyson SabalGallipolis Fer16109604AOZHY'11914782916109604[facility] Med Cente161096096295284295621306578Brunei DarussalaChild psychotherapis36t7T J [facility]16109604Orvilla Cornwall16109651098QIONG'16109161096045[address]0981191440981355 [place] RWUJWJ'16109604086578464098Alfonso Ram57u045481191478Latanya PresseZOXWR512-392-16916109604086578461610960604540981Lesly Duke161096048699 [place] S829562182956213Armanda HeritaLindi A17d161096041ZOXWR'161GEXBM'Elon AlZOXWR'U16109604[facility] LLC132440140471476[facility] LLCJasmine Decembe1610960Intel Corporati73Marla Ro161096040m4295284167333 Willow AvenMichaell Cowin4098119116109604161096048[address]Hedy Camara40981191865769629528Erie Noe09811914[facility] Dba Surgery Center Of The Pacif55w5d0454098191478296440347(731)412-274Marvel Pl045409811Eulah PonZOXWR'UMarguarite ArbouXBJYN'306-766-4914782956UnitedHea927lKentuckythAuburn Bilberry409811914346962952161096050451610960[facility] LLCopiah County Medical CenteMarga Melnic7829562161096041610960VHQIO'1914782Allyson SabalWalla16109604AOZHY'11914782916109604[facility] LL161096096295284295621306578Brunei DarussalaChild psychotherapis7t7[facility] [place] LLC16109604Orvilla Cornwall161096300594QIONG'161091610960[address]8119144098113 [place] LaWUJWJ'16109604086578464098Alfonso Ram40u045481191478Latanya PresseZOXWR205 531 27616109604086578461610960604540981Lesly Duke161096047591 [place] Dri829562182956213Armanda HeritaLindi A62d161096051ZOXWR'161GEXBM'Elon AlZOXWR'U16109604[facility]1324401432-513-72[facility] - HartonJasmine Decembe1610960Intel Corporati50Marla Ro161096052m42952841743547 Brandywine SMichaell Cowin40981191161096041610960[address]Hedy Camara40981191865769629528Erie Noe09811914[facility] L59w5d0454098191478296440347712-682-141Marvel Pl045409811Eulah PonZOXWR'UMarguarite ArbouXBJYN'475 832 3714782956UnitedHea441lKentuckythAuburn Bilberry4098119144446962952161096070451610960Kind[facility] Hospital Clear LakSidney Health CenteMarga Melnic7829562161096041610960VHQIO'1914782Allyson SabalRamo16109604AOZHY'11914782916109604[facility] Medical Cente161096096295284295621306578Brunei DarussalaChild psychotherapis55t5[facility] Ltd16109604Orvilla Cornwall161096400884QIONG'161091610960[address]098119144098188 [place] SWUJWJ'16109604086578464098Alfonso Ram70u045481191478Latanya PresseZOXWR204-422-65016109604086578461610960604540981Lesly Duke1610960483 Logan Stre829562182956213Armanda HeritaLindi A66d161096034ZOXWR'161GEXBM'Elon AlZOXWR'U16109604[facility]1324401217-584-81Sinai-Grace HospitaJasmine Decembe1610960Intel Corporati16Marla Ro161096035m429528411937065B J[place] StreMichaell Cowin4098119116109604161096046[address]Hedy Camara40981191865769629528Erie Noe09811914[facility] - Silverda28w5d0454098191478296440347712-256-040Marvel Pl045409811Eulah PonZOXWR'UMarguarite ArbouXBJYN'314 780 2314782956UnitedHea7665lKentuckythAuburn Bilberry409811914546962952161096038451610960The Surgery Center Of The Villages LLEssentia Health SandstoneMarga Melnic7829562161096041610960VHQIO'1914782Allyson SabalWilliams16109604AOZHY'11914782916109604[facility] Hospita161096096295284295621306578Brunei DarussalaChild psychotherapis67t7[facility]16109604Orvilla Cornwall161096890604QIONG'16109161096045[address]0981191440981483 [place] AvenWUJWJ'16109604086578464098Alfonso Ram36u045481191478Latanya PresseZOXWR9040913216109604086578461610960604540981Lesly Duke1610960450 University Stre829562182956213Armanda HeritaLindi A74d161096059ZOXWR'161GEXBM'Elon AlZOXWR'U16109604[facility]1324401973-771-59Kona Community HospitaJasmine Decembe1610960Intel Corporati59Marla Ro161096066m42952841523183 York SMichaell Cowin4098119116109604161096047695 [address]Hedy Camara40981191865769629528Erie Noe09811914Bet[facility] Surgery Cent59w5d0454098191478296440347(513)231-747Marvel Pl045409811Eulah PonZOXWR'UMarguarite ArbouXBJYN'817-373-8414782956UnitedHea5674lKentuckythAuburn Bilberry4098119142746962952161096080451610960Pcs Endoscopy SuitBeaver Dam Com HsptlMarga Melnic7829562161096041610960VHQIO'1914782Allyson SabalPalm B16109604AOZHY'11914782916109604[facility] Cente161096096295284295621306578Brunei DarussalaChild psychotherapis35t6[facility]16109604Orvilla Cornwall161096300134QIONG'1610916109604589[address]0981191440981416 King SWUJWJ'16109604086578464098Alfonso Ram4u045481191478Latanya PresseZOXWR(778)480-42516109604086578461610960604540981Lesly Duke16109604734 [place] S829562182956213Armanda HeritaLindi A72d161096076ZOXWR'161GEXBM'Elon AlZOXWR'U16109604[facility] LP1324401(607)019-35Ken[facility] Community HospitaJasmine Decembe1610960Intel Corporati55Marla Ro161096035m42952841623298 NE. Helen CouMichaell Cowin409811911610960416109604869C Peninsula LaneHedy Camara40981191865769629528Erie Noe09811914North [facility] Gi Center Dba [facility] Endoscopy Cent3w5d0454098191478296440347321-655-611Marvel Pl045409811Eulah PonZOXWR'UMarguarite ArbouXBJYN'(438) 603-9414782956UnitedHea666lKentuckythAuburn Bilberry409811914246962952161096035451610960[facility] PLLRiverside Park Surgicenter IncMarga Melnic7829562161096041610960VHQIO'1914782Allyson SabalGoodmanvil16109604AOZHY'11914782916109604[facility] Cente161096096295284295621306578Brunei DarussalaChild psychotherapis74t5[facility]16109604Orvilla Cornwall161096330674QIONG'161091610960458055 [address]09811914409818642 [place] SWUJWJ'16109604086578464098Alfonso Ram43u045481191478Latanya PresseZOXWR(450)884-47516109604086578461610960604540981Lesly Duke1610960458 E. Division S829562182956213Armanda HeritaLindi A76d161096090ZOXWR'161GEXBM'Elon AlZOXWR'U16109604[facility]132440131009498Northern [facility] Surgery [facility] LLCJasmine Decembe1610960Intel Corporati49Marla Ro161096016m42952841293689 [place] RoMichaell Cowin409811911610960416109604639 Summer AvenueHedy Camara40981191865769629528Erie Noe09811914[facility] Surgery Cent18w5d0454098191478296440347740-024-952Marvel Pl045409811Eulah PonZOXWR'UMarguarite ArbouXBJYN'(386) 594-6614782956UnitedHea3532lKentuckythAuburn Bilberry409811914446962952161096088451610960[facility] Center - Indian RocksEye Care Surgery Center MemphisMarga Melnic7829562161096041610960VHQIO'1914782Allyson SabalSnowvil16109604AOZHY'11914782916109604[facility] Lt161096096295284295621306578Brunei DarussalaChild psychotherapis73t4[facility]16109604Orvilla Cornwall161096260294QIONG'16109161096045[address]09811914409817471 Roosevelt StreWUJWJ'16109604086578464098Alfonso Ram45u045481191478Latanya PresseZOXWR260-679-90016109604086578461610960604540981Lesly Duke16109604539 Orange R829562182956213Armanda HeritaLindi A64d161096029ZOXWR'161GEXBM'Elon AlZOXWR'U16109604Sportsortho Surgery Center LLC132440157957037Wops IncJasmine Decembe1610960Intel Corporati36Marla Ro161096032m42952841253206 Pin Oak DMichaell Cowin4098119116109604161096047246 [address]Hedy Camara40981191865769629528Erie Noe09811914[facility] L66w5d0454098191478296440347605-044-997Marvel Pl045409811Eulah PonZOXWR'UMarguarite ArbouXBJYN'(334) 487-5114782956UnitedHea2948lKentuckythAuburn Bilberry409811914646962952161096071451610960[facility] Hospit[facility] Centers BryanUnc Rockingham HospitalMarga Melnic7829562161096041610960VHQIO'1914782Allyson SabalLow16109604AOZHY'11914782916109604[facility] LL161096096295284295621306578Brunei DarussalaChild psychotherapis32t2[facility]16109604Orvilla Cornwall161096760594QIONG'16109161096045400 [address]098119144098165 Holly SWUJWJ'16109604086578464098Alfonso Ram65u045481191478Latanya PresseZOXWR309-575-80316109604086578461610960604540981Lesly Duke1610960412 [place] La829562182956213Armanda HeritaLindi A66d161096070ZOXWR'161GEXBM'Elon AlZOXWR'U16109604[facility]132440194921643Land[facility] Hospital Of JoplinJasmine Decembe1610960Intel Corporati13Marla Ro161096074m429528417533 Rock Maple SMichaell Cowin409811911610960416109604[address]Hedy Camara40981191865769629528Erie Noe09811914St. [facility]2w5d0454098191478296440347(360) 795-278Marvel Pl045409811Eulah PonZOXWR'UMarguarite ArbouXBJYN'276-327-0014782956UnitedHea745lKentuckythAuburn Bilberry409811914246962952161096041451610960[facility] LLAcmh HospitalMarga Melnic7829562161096041610960VHQIO'1914782Allyson SabalPrime16109604AOZHY'11914782916109604[facility] Uni161096096295284295621306578Brunei DarussalaChild psychotherapis2t7[facility]16109604Orvilla Cornwall161096510394QIONG'1610916109604588[address]098119144098132 [place] RoWUJWJ'16109604086578464098Alfonso Ram61u045481191478Latanya PresseZOXWR864-131-76216109604086578461610960604540981Lesly Duke16109604603 Mill Dri829562182956213Armanda HeritaLindi A74d161096063ZOXWR'161GEXBM'Elon AlZOXWR'U16109604[facility]1324401769-197-16[facility] Hospital PensacolaJasmine Decembe1610960Intel Corporati20Marla Ro161096045

## 2014-08-06 NOTE — ED Notes (Signed)
One unsuccessful attempt at IV insertion and Lab collection.

## 2014-08-06 NOTE — Consult Note (Signed)
WOC wound consult note Reason for Consult:Bilateral LE Wound type:venous insufficiency with cellulitis Pressure Ulcer POA: No Measurement: medial right LE has three full thickness areas in a 4cm x 4cm x 0.2cm area.  Left lateral LE has two open areas (full thickness) in a 5.5cm x 5cm x 0.2cm area.  There are numerous healing and partial thickness areas on both LEs. Wound bed: red, moist with scant amounts of finbrinous yellow slough on the full thickness areas. Drainage (amount, consistency, odor) scant serous Periwound:erythematous, edematous Dressing procedure/placement/frequency: I have provided orders for the topical care and cleansing of the patient's LEs and have added application of twice weekly Unna's boots.  Ortho Tech will begin those today.  Patient is taught about the purpose of the boots and is in agreement that the fluid must be relocated out of her LEs.  She mentions that the edema is much better since she has been in bed.  She mentioned that she treats her LEs ulcers holistically with herbs, "Vitamin O" (oxygen) and other topical preparations. I am unsure if she will agree to the Unna's Boots over time, but if she will, she will require referral to wither [facility] or to an outpatient wound care center of her choosing for maintenance of the Unna's boots and for eventual placement into compression hosiery.  If you agree, please order upon discharge.   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 2 and 1/4 inch pouching system with skin barrier ring.  Orders provided for nursing staff and two extra set-ups of ostomy pouching supplies left in room.  We do not carry the deodorizing  lubricant that patient uses at home.  She inquires if it is acceptable for her use if someone can bring it to her from her home and I am happy to agree to that.  Patient can be independent in that product use. Education provided: Patient has had her ostomy for several years now and is competent in her routine and in the obtaining of supplies. She is well versed and needs no education today. Enrolled patient in [employer] DC program: No  WOC nursing team will not follow, but will remain available to this patient, the nursing and medical teams.  Please re-consult if needed. Thanks, Ladona Mow, MSN, RN, GNP, CWOCN, CWON-AP [phone])  Time spent in two visits today: 60 minutes

## 2014-08-06 NOTE — ED Notes (Signed)
Hospitalist [place] at bedside. 

## 2014-08-06 NOTE — Progress Notes (Signed)
ANTIBIOTIC CONSULT NOTE - INITIAL  Pharmacy Consult for Vancomycin and Aztreonam Indication: Cellulitis  Allergies  Allergen Reactions  . Erythromycin Nausea And Vomiting  . Penicillins Other (See Comments)    Makes dizzy    Patient Measurements:   Weight: approximately 73kg  Vital Signs: Temp: 98.4 F (36.9 C) (08/05 0701) Temp Source: Oral (08/05 0701) BP: 146/56 mmHg (08/05 0701) Pulse Rate: 101 (08/05 0701) Intake/Output from previous day: 08/04 0701 - 08/05 0700 In: -  Out: 40 [Urine:40] Intake/Output from this shift:    Labs:  Recent Labs  08/06/14 0418  WBC 5.4  HGB 10.8*  PLT 248  CREATININE 1.15*   CrCl cannot be calculated (Unknown ideal weight.). No results for input(s): VANCOTROUGH, VANCOPEAK, VANCORANDOM, GENTTROUGH, GENTPEAK, GENTRANDOM, TOBRATROUGH, TOBRAPEAK, TOBRARND, AMIKACINPEAK, AMIKACINTROU, AMIKACIN in the last 72 hours.   Microbiology: No results found for this or any previous visit (from the past 720 hour(s)).  Medical History: Past Medical History  Diagnosis Date  . S/P colostomy 2008  . Shingles     Medications:  Scheduled:  . aztreonam  1 g Intravenous Q8H  . enoxaparin (LOVENOX) injection  40 mg Subcutaneous Q24H  . vancomycin  750 mg Intravenous Q12H  . vitamin B-12  1,000 mcg Oral Daily   Infusions:  . sodium chloride 75 mL/hr at 08/06/14 0840   PRN: ondansetron **OR** ondansetron (ZOFRAN) IV  Assessment: 81 y.o. female with history of large ventral hernia and previous history of bowel obstruction status post colostomy bag placement was not been to a physician for many years was brought to the ER after patient had a fall. Chest x-ray reveals right-sided rib fractures. On exam patient's both lower extremity is edematous with erythema and discharge.  8/5 Clinda x 1 8/5 >> Vanc >> 8/5 >> Aztreo >>  Tmax: afebrile WBC: 5.4k Renal: SCr 1.15 (baseline 0.6-0.7), CrCl ~42 ml/min/1.31m2 (normalized)  8/5 blood x 2: 8/5  UA: pyruria, nitrate (+)   Goal of Therapy:  Vancomycin trough level 10-15 mcg/ml  Antibiotic doses appropriate for indication and renal function  Plan:   Vancomycin 1g IV already given, continue with  IV q12h Check trough at steady state Aztreonam 2g IV x 1, then 1g IV q8h Follow up renal function & cultures Narrow spectrum as clinically appropriate  Loralee Pacas, PharmD, BCPS Pager: [phone] 08/06/2014,9:23 AM

## 2014-08-06 NOTE — ED Notes (Signed)
Cultures collected by  Main lab.

## 2014-08-06 NOTE — ED Notes (Signed)
Pt aware of the need for a urine sample. 

## 2014-08-06 NOTE — H&P (Signed)
[facility] History and Physical  KINDLE STROHMEIER ZOX:[id number] DOB: 1928-09-08 DOA: 08/06/2014  Referring physician: Dr.Otter. PCP: Paulino Rily, MD  Specialists: None.  Chief Complaint: Fall.  HPI: [name] is a 81 y.o. female with history of large ventral hernia and previous history of bowel obstruction status post colostomy bag placement was not been to a physician for many years was brought to the ER after patient had a fall. Chest x-ray reveals right-sided rib fractures. On exam patient's both lower extremity is edematous with erythema and discharge. Patient states over the last 3 weeks patient has developed skin excoriation with discharge. Patient has been applying some local spices on it. Patient states that today patient was trying to walk with the help of walker when she lost her balance and fell. Denies hitting her head or losing consciousness. Denies any shortness of breath nausea vomiting diarrhea abdominal pain. Patient has lower extremity edema which patient states has been there for last 3 weeks.   Review of Systems: As presented in the history of presenting illness, rest negative.  Past Medical History  Diagnosis Date  . S/P colostomy 2008  . Shingles    Past Surgical History  Procedure Laterality Date  . Colostomy     Social History:  reports that she has never smoked. She does not have any smokeless tobacco history on file. She reports that she does not drink alcohol. Her drug history is not on file. Where does patient live home. Can patient participate in ADLs? Yes.  Allergies  Allergen Reactions  . Erythromycin Nausea And Vomiting  . Penicillins Other (See Comments)    Makes dizzy    Family History:  Family History  Problem Relation Age of Onset  . CAD Mother       Prior to Admission medications   Medication Sig Start Date End Date Taking? Authorizing Provider  co-enzyme Q-10 30 MG capsule Take 30 mg by mouth daily.     Yes  Historical Provider, MD  OIL OF OREGANO PO Take 10 mLs by mouth daily.   Yes Historical Provider, MD  Probiotic Product (PROBIOTIC FORMULA PO) Take 1 capsule by mouth daily.     Yes Historical Provider, MD  vitamin B-12 (CYANOCOBALAMIN) 1000 MCG tablet Take 1,000 mcg by mouth daily.   Yes Historical Provider, MD  vitamin C (ASCORBIC ACID) 500 MG tablet Take 1,000 mg by mouth daily.   Yes Historical Provider, MD    Physical Exam: Filed Vitals:   08/06/14 0336 08/06/14 0350 08/06/14 0601  BP: 143/44  138/51  Pulse: 87 95 101  Temp: 98.7 F (37.1 C)  98.5 F (36.9 C)  TempSrc: Oral  Oral  Resp: 14  16  SpO2: 98% 97% 95%     General:  Moderately built and nourished.  Eyes: Anicteric no pallor.  ENT: No discharge from the ears eyes nose and mouth.  Neck: No mass felt.  Cardiovascular: S1-S2 heard.  Respiratory: No rhonchi or crepitations.  Abdomen: Large ventral hernia. With colostomy bag on the left side.  Skin: Bilateral lower extremity cellulitis with mild discharge and excoriation extending up to the knee.  Musculoskeletal: Bilateral lower extremity edema.  Psychiatric: Appears normal.  Neurologic: Alert awake oriented to time place and person. Moves all extremities.  Labs on Admission:  Basic Metabolic Panel:  Recent Labs Lab 08/06/14 0418  NA 137  K 4.7  CL 108  CO2 20*  GLUCOSE 139*  BUN 34*  CREATININE 1.15*  CALCIUM 8.4*  Liver Function Tests:  Recent Labs Lab 08/06/14 0418  AST 27  ALT 23  ALKPHOS 84  BILITOT 0.5  PROT 6.1*  ALBUMIN 2.8*   No results for input(s): LIPASE, AMYLASE in the last 168 hours. No results for input(s): AMMONIA in the last 168 hours. CBC:  Recent Labs Lab 08/06/14 0418  WBC 5.4  NEUTROABS 3.7  HGB 10.8*  HCT 32.8*  MCV 98.8  PLT 248   Cardiac Enzymes: No results for input(s): CKTOTAL, CKMB, CKMBINDEX, TROPONINI in the last 168 hours.  BNP (last 3 results) No results for input(s): BNP in the last 8760  hours.  ProBNP (last 3 results) No results for input(s): PROBNP in the last 8760 hours.  CBG: No results for input(s): GLUCAP in the last 168 hours.  Radiological Exams on Admission: Dg Ribs Unilateral W/chest Right  08/06/2014   CLINICAL DATA:  Tripped and fall over walker.  EXAM: RIGHT RIBS AND CHEST - 3+ VIEW  COMPARISON:  None.  FINDINGS: Acute RIGHT posterior mildly displaced fourth through eighth rib fractures. Patient is osteopenic.  No pneumothorax. Mildly elevated RIGHT hemidiaphragm with strandy densities. LEFT lung base atelectasis/scarring. Cardiomediastinal silhouette is nonsuspicious.  IMPRESSION: Acute mildly displaced RIGHT posterior fourth through eighth rib fractures. No pneumothorax. RIGHT lung base atelectasis or possible contusion.  LEFT lung base atelectasis/ scarring.   Electronically Signed   By: Awilda Metro M.D.   On: 08/06/2014 05:37    EKG: Independently reviewed. Sinus tachycardia with probable LVH.  Assessment/Plan Principal Problem:   Cellulitis of both lower extremities Active Problems:   Ventral hernia   S/P colostomy   Acute renal failure   Rib fracture   1. Cellulitis of the both lower extremity - at this time I have placed patient on vancomycin and Azactam. Consult wound team. Check x-rays of the both legs to rule out any bony involvement. 2. Fall with rib fracture - physical therapy consult. Check x-ray of the pelvis which is pending. 3. Acute renal failure probably from dehydration also has non-anion gap metabolic acidosis - gently hydrate and recheck metabolic panel. 4. Possible UTI - follow urine cultures. Patient is on antibiotics. 5. Chronic anemia - follow CBC. 6. History of bowel obstruction status post colostomy and presently has ventral hernia - closely observe.  Patient refuses any pain medications. X-ray of the pelvis and both legs are pending.  I have reviewed patient's old charts were labs and personally reviewed x-rays and  EKG.   DVT Prophylaxis Lovenox. Code Status: Full code. This will need to be further discussed with patient as patient does not want to fully discuss about it.  Family Communication: Discussed with patient.  Disposition Plan: Admit to inpatient.    Quiera Diffee N. [facility] Pager [phone].  If 7PM-7AM, please contact night-coverage [email or web address] Password [facility] 08/06/2014, 6:33 AM

## 2014-08-06 NOTE — ED Notes (Signed)
Main lab to bedside 

## 2014-08-06 NOTE — Progress Notes (Signed)
*  Preliminary Results* Bilateral lower extremity venous duplex completed. Study was technically limited due to poor patient cooperation and open sores on bilateral lower extremities. Visualized veins of bilateral lower extremities are negative for deep vein thrombosis. There is no evidence of Baker's cyst bilaterally.  08/06/2014  Gertie Fey, RVT, RDCS, RDMS

## 2014-08-06 NOTE — ED Notes (Signed)
Report given to 3w

## 2014-08-06 NOTE — Progress Notes (Signed)
PT Cancellation Note  Patient Details Name: WESLEIGH MARKOVIC MRN: [id number] DOB: 1928/11/09   Cancelled Treatment:     PT eval received but deferred this date.  Pt adamantly refused 2* pain with movement from rib fx.  Will follow and progress as pt tolerates.   Ninette Cotta 08/06/2014, 12:20 PM

## 2014-08-06 NOTE — Progress Notes (Signed)
[facility] PROGRESS NOTE  MARCELLINA JONSSON ZHY:[id number] DOB: [birth year] DOA: 08/06/2014 PCP: Paulino Rily, MD  Please refer to admission H&P for details, in brief, 81 year old female with large ventral hernia with history of bowel obstruction status post colostomy presented with a fall and chest x-ray showing right-sided rib fractures. Patient has bilateral cellulitis of the legs with edema, bilateral erythema and discharge. Admitted for bilateral lower leg cellulitis. Doppler negative for DVT. Wound care consulted.  Assessment/Plan: Bilateral lower leg cellulitis On empiric vancomycin and aztreonam. Seen by wound care consult and dressing applied. X-ray of the legs show arthritic changes and soft tissue swelling only. Doppler negative for DVT.  Fall with right rib fractures (4th-8th rib) Will place on when necessary IV Toradol. We will also order Lidoderm patch. PT evaluation. Pelvic x-ray negative for any fracture.  Acute kidney injury with mild non-and gap metabolic acidosis Possibly from dehydration. Resolved with IV fluids.  ? UTI Follow culture. On empiric antibiotics.  Large ventral hernia with history of bowel obstruction status post colostomy Monitor clinically.  DVT prophylaxis: Subcutaneous heparin Diet: Regular  Code Status: Full code Family Communication: None at bedside Disposition Plan: Currently inpatient   Consultants:  Wound care  Procedures:  Doppler lower extremity  Antibiotics:  Vancomycin and aztreonam 8/5--  HPI/Subjective: Patient seen and examined this evening. Complains of pain in her right ribs. Admission H&P reviewed.  Objective: Filed Vitals:   08/06/14 1303  BP: 93/70  Pulse: 88  Temp: 98.5 F (36.9 C)  Resp: 16    Intake/Output Summary (Last 24 hours) at 08/06/14 1824 Last data filed at 08/06/14 1303  Gross per 24 hour  Intake    120 ml  Output    390 ml  Net   -270 ml   Filed Weights   08/06/14 0711  Weight:  73.619 kg (162 lb 4.8 oz)    Exam:   General:  Elderly female in no acute distress,   HEENT: No pallor, moist oral mucosa, supple neck  Chest: Clear to auscultation bilaterally tender to pressure over right lateral ribs  CVS: Normal S1 and S2, no murmurs  GI: Large ventral hernia, colostomy bag in place, bowel sounds present  Musculoskeletal skeletal: Ace wrap over bilateral legs  CNS: Alert and oriented    Data Reviewed: Basic Metabolic Panel:  Recent Labs Lab 08/06/14 0418 08/06/14 1415  NA 137 138  K 4.7 4.9  CL 108 107  CO2 20* 22  GLUCOSE 139* 124*  BUN 34* 28*  CREATININE 1.15* 0.85  CALCIUM 8.4* 8.0*   Liver Function Tests:  Recent Labs Lab 08/06/14 0418 08/06/14 1415  AST 27 45*  ALT 23 24  ALKPHOS 84 75  BILITOT 0.5 1.0  PROT 6.1* 5.7*  ALBUMIN 2.8* 2.5*   No results for input(s): LIPASE, AMYLASE in the last 168 hours. No results for input(s): AMMONIA in the last 168 hours. CBC:  Recent Labs Lab 08/06/14 0418 08/06/14 1415  WBC 5.4 2.6*  NEUTROABS 3.7 1.0*  HGB 10.8* 11.1*  HCT 32.8* 33.3*  MCV 98.8 98.5  PLT 248 211   Cardiac Enzymes: No results for input(s): CKTOTAL, CKMB, CKMBINDEX, TROPONINI in the last 168 hours. BNP (last 3 results) No results for input(s): BNP in the last 8760 hours.  ProBNP (last 3 results) No results for input(s): PROBNP in the last 8760 hours.  CBG: No results for input(s): GLUCAP in the last 168 hours.  No results found for this or any previous visit (  from the past 240 hour(s)).   Studies: Dg Ribs Unilateral W/chest Right  08/06/2014   CLINICAL DATA:  Tripped and fall over walker.  EXAM: RIGHT RIBS AND CHEST - 3+ VIEW  COMPARISON:  None.  FINDINGS: Acute RIGHT posterior mildly displaced fourth through eighth rib fractures. Patient is osteopenic.  No pneumothorax. Mildly elevated RIGHT hemidiaphragm with strandy densities. LEFT lung base atelectasis/scarring. Cardiomediastinal silhouette is  nonsuspicious.  IMPRESSION: Acute mildly displaced RIGHT posterior fourth through eighth rib fractures. No pneumothorax. RIGHT lung base atelectasis or possible contusion.  LEFT lung base atelectasis/ scarring.   Electronically Signed   By: Awilda Metro M.D.   On: 08/06/2014 05:37   Dg Tibia/fibula Left  08/06/2014   CLINICAL DATA:  Bilateral tib-fib pain.  Cellulitis.  EXAM: LEFT TIBIA AND FIBULA - 2 VIEW  COMPARISON:  None.  FINDINGS: Degenerative changes within the left knee. Diffuse soft tissue swelling. No fracture, subluxation or dislocation. No radiopaque foreign bodies.  IMPRESSION: Diffuse soft tissue swelling.  No acute bony abnormality.   Electronically Signed   By: Charlett Nose M.D.   On: 08/06/2014 10:12   Dg Tibia/fibula Right  08/06/2014   CLINICAL DATA:  Bilateral tibial/fibula pain.  Cellulitis.  EXAM: RIGHT TIBIA AND FIBULA - 2 VIEW  COMPARISON:  None.  FINDINGS: There is diffuse soft tissue swelling in the visualized right lower extremity. No fracture, suspicious focal osseous lesion, periosteal reaction or focal cortical erosions are seen in the right tibia or right fibula. Vascular calcifications are noted in the soft tissues. Degenerative changes are present at the right knee and right ankle joints. There is a small plantar right calcaneal spur. No malalignment is seen at the right knee or right ankle on the provided views.  IMPRESSION: Diffuse soft tissue swelling. No radiographic evidence of osteomyelitis.   Electronically Signed   By: Delbert Phenix M.D.   On: 08/06/2014 10:17   Dg Pelvis Portable  08/06/2014   CLINICAL DATA:  Larey Seat at home today, pelvic pain  EXAM: PORTABLE PELVIS 1-2 VIEWS  COMPARISON:  Portable exam 0906 hours compared to CT of 06/29/2010  FINDINGS: Diffuse osseous demineralization.  Hip and SI joint spaces grossly normal.  No acute fracture, dislocation or bone destruction.  Degenerative disc disease changes at poorly visualized lower lumbar spine.  Ostomy LEFT  lower quadrant.  IMPRESSION: No definite acute osseous abnormalities.  Osseous demineralization with degenerative disc disease changes at visualized lower lumbar spine.   Electronically Signed   By: Ulyses Southward M.D.   On: 08/06/2014 10:10    Scheduled Meds: . aztreonam  1 g Intravenous Q8H  . enoxaparin (LOVENOX) injection  40 mg Subcutaneous Q24H  . vancomycin  750 mg Intravenous Q12H  . vitamin B-12  1,000 mcg Oral Daily   Continuous Infusions: . sodium chloride 75 mL/hr at 08/06/14 0840     Time spent: 15 minutes    Mishayla Sliwinski  [facility] Pager [phone]. If 7PM-7AM, please contact night-coverage at [email or web address], password [facility] 08/06/2014, 6:24 PM  LOS: 0 days

## 2014-08-06 NOTE — ED Notes (Signed)
Bed: WA15 Expected date:  Expected time:  Means of arrival:  Comments: Ems 

## 2014-08-06 NOTE — ED Notes (Addendum)
JC from pharmacy reports it is safe to administer Vancomycin and Clindamycin. Pt' s only known reaction is nausea and vomiting.

## 2014-08-06 NOTE — ED Notes (Signed)
Pt attempted to provide a urine sample. Pt only able to provide a few drops. Water provided will continue to monitor.

## 2014-08-07 DIAGNOSIS — S2231XA Fracture of one rib, right side, initial encounter for closed fracture: Secondary | ICD-10-CM

## 2014-08-07 DIAGNOSIS — L03115 Cellulitis of right lower limb: Secondary | ICD-10-CM

## 2014-08-07 DIAGNOSIS — L03116 Cellulitis of left lower limb: Secondary | ICD-10-CM

## 2014-08-07 LAB — CBC
HEMATOCRIT: 31.6 % — AB (ref 36.0–46.0)
Hemoglobin: 10.3 g/dL — ABNORMAL LOW (ref 12.0–15.0)
MCH: 31.6 pg (ref 26.0–34.0)
MCHC: 32.6 g/dL (ref 30.0–36.0)
MCV: 96.9 fL (ref 78.0–100.0)
Platelets: 181 10*3/uL (ref 150–400)
RBC: 3.26 MIL/uL — ABNORMAL LOW (ref 3.87–5.11)
RDW: 12.5 % (ref 11.5–15.5)
WBC: 2 10*3/uL — ABNORMAL LOW (ref 4.0–10.5)

## 2014-08-07 NOTE — Progress Notes (Signed)
[facility] PROGRESS NOTE  Assessment/Plan: Cellulitis of both lower extremities - Empirically on vancomycin and aztreonam, has remained afebrile with no leukocytosis. - 2pc. 2 and 1/4 inch pouching system with skin barrier ring. - PT consult.  Acute renal failure: - Resolved with IV hydration.  Rib fracture: - on IV Toradol.  S/P colostomy due to Ventral hernia  Large ventral hernia with history of bowel obstruction status post colostomy Monitor clinically.  Code Status: full Family Communication: none at bedside  Disposition Plan: inpatient   Consultants:  Wound care  Procedures:  Doppler ext  Antibiotics:  Vancomycin and aztreonam 8/5  HPI/Subjective: She relates she feels better.  Objective: Filed Vitals:   08/06/14 0711 08/06/14 1303 08/06/14 2040 08/07/14 0447  BP:  93/70 117/52 126/60  Pulse:  88 93 91  Temp:  98.5 F (36.9 C) 98.1 F (36.7 C) 99.3 F (37.4 C)  TempSrc:  Oral Oral Oral  Resp:  16 16 16   Height: 4\' 4"  (1.321 m)     Weight: 73.619 kg (162 lb 4.8 oz)     SpO2:  98% 94% 94%    Intake/Output Summary (Last 24 hours) at 08/07/14 0801 Last data filed at 08/06/14 2040  Gross per 24 hour  Intake   1245 ml  Output    350 ml  Net    895 ml   Filed Weights   08/06/14 0711  Weight: 73.619 kg (162 lb 4.8 oz)    Exam:  General: Alert, awake, oriented x3, in no acute distress.  HEENT: No bruits, no goiter.  Heart: Regular rate and rhythm. Lungs: Good air movement, clear Abdomen: Soft, nontender, nondistended, positive bowel sounds.  Neuro: Grossly intact, nonfocal.   Data Reviewed: Basic Metabolic Panel:  Recent Labs Lab 08/06/14 0418 08/06/14 1415  NA 137 138  K 4.7 4.9  CL 108 107  CO2 20* 22  GLUCOSE 139* 124*  BUN 34* 28*  CREATININE 1.15* 0.85  CALCIUM 8.4* 8.0*   Liver Function Tests:  Recent Labs Lab 08/06/14 0418 08/06/14 1415  AST 27 45*  ALT 23 24  ALKPHOS 84 75  BILITOT 0.5 1.0  PROT  6.1* 5.7*  ALBUMIN 2.8* 2.5*   No results for input(s): LIPASE, AMYLASE in the last 168 hours. No results for input(s): AMMONIA in the last 168 hours. CBC:  Recent Labs Lab 08/06/14 0418 08/06/14 1415 08/07/14 0357  WBC 5.4 2.6* 2.0*  NEUTROABS 3.7 1.0*  --   HGB 10.8* 11.1* 10.3*  HCT 32.8* 33.3* 31.6*  MCV 98.8 98.5 96.9  PLT 248 211 181   Cardiac Enzymes: No results for input(s): CKTOTAL, CKMB, CKMBINDEX, TROPONINI in the last 168 hours. BNP (last 3 results) No results for input(s): BNP in the last 8760 hours.  ProBNP (last 3 results) No results for input(s): PROBNP in the last 8760 hours.  CBG: No results for input(s): GLUCAP in the last 168 hours.  No results found for this or any previous visit (from the past 240 hour(s)).   Studies: Dg Ribs Unilateral W/chest Right  08/06/2014   CLINICAL DATA:  Tripped and fall over walker.  EXAM: RIGHT RIBS AND CHEST - 3+ VIEW  COMPARISON:  None.  FINDINGS: Acute RIGHT posterior mildly displaced fourth through eighth rib fractures. Patient is osteopenic.  No pneumothorax. Mildly elevated RIGHT hemidiaphragm with strandy densities. LEFT lung base atelectasis/scarring. Cardiomediastinal silhouette is nonsuspicious.  IMPRESSION: Acute mildly displaced RIGHT posterior fourth through eighth rib fractures. No pneumothorax. RIGHT lung  base atelectasis or possible contusion.  LEFT lung base atelectasis/ scarring.   Electronically Signed   By: Awilda Metro M.D.   On: 08/06/2014 05:37   Dg Tibia/fibula Left  08/06/2014   CLINICAL DATA:  Bilateral tib-fib pain.  Cellulitis.  EXAM: LEFT TIBIA AND FIBULA - 2 VIEW  COMPARISON:  None.  FINDINGS: Degenerative changes within the left knee. Diffuse soft tissue swelling. No fracture, subluxation or dislocation. No radiopaque foreign bodies.  IMPRESSION: Diffuse soft tissue swelling.  No acute bony abnormality.   Electronically Signed   By: Charlett Nose M.D.   On: 08/06/2014 10:12   Dg Tibia/fibula  Right  08/06/2014   CLINICAL DATA:  Bilateral tibial/fibula pain.  Cellulitis.  EXAM: RIGHT TIBIA AND FIBULA - 2 VIEW  COMPARISON:  None.  FINDINGS: There is diffuse soft tissue swelling in the visualized right lower extremity. No fracture, suspicious focal osseous lesion, periosteal reaction or focal cortical erosions are seen in the right tibia or right fibula. Vascular calcifications are noted in the soft tissues. Degenerative changes are present at the right knee and right ankle joints. There is a small plantar right calcaneal spur. No malalignment is seen at the right knee or right ankle on the provided views.  IMPRESSION: Diffuse soft tissue swelling. No radiographic evidence of osteomyelitis.   Electronically Signed   By: Delbert Phenix M.D.   On: 08/06/2014 10:17   Dg Pelvis Portable  08/06/2014   CLINICAL DATA:  Larey Seat at home today, pelvic pain  EXAM: PORTABLE PELVIS 1-2 VIEWS  COMPARISON:  Portable exam 0906 hours compared to CT of 06/29/2010  FINDINGS: Diffuse osseous demineralization.  Hip and SI joint spaces grossly normal.  No acute fracture, dislocation or bone destruction.  Degenerative disc disease changes at poorly visualized lower lumbar spine.  Ostomy LEFT lower quadrant.  IMPRESSION: No definite acute osseous abnormalities.  Osseous demineralization with degenerative disc disease changes at visualized lower lumbar spine.   Electronically Signed   By: Ulyses Southward M.D.   On: 08/06/2014 10:10    Scheduled Meds: . aztreonam  1 g Intravenous Q8H  . enoxaparin (LOVENOX) injection  40 mg Subcutaneous Q24H  . lidocaine  1 patch Transdermal Q24H  . vancomycin  750 mg Intravenous Q12H  . vitamin B-12  1,000 mcg Oral Daily   Continuous Infusions:   Time Spent: 25 min   Marinda Elk  [facility] Pager [phone]. If 7PM-7AM, please contact night-coverage at [email or web address], password [facility] 08/07/2014, 8:01 AM  LOS: 1 day

## 2014-08-07 NOTE — Progress Notes (Signed)
PT Cancellation Note  Patient Details Name: [name] MRN: [id number] DOB: 1928/11/23   Cancelled Treatment:     PT eval re-attempted.  Pt continues to refuse 2* pain with movement.  Will follow.   Corbet Hanley 08/07/2014, 3:28 PM

## 2014-08-08 LAB — VANCOMYCIN, TROUGH: Vancomycin Tr: 15 ug/mL (ref 10.0–20.0)

## 2014-08-08 MED ORDER — SACCHAROMYCES BOULARDII 250 MG PO CAPS
250.0000 mg | ORAL_CAPSULE | Freq: Two times a day (BID) | ORAL | Status: DC
  Administered 2014-08-08 – 2014-08-09 (×3): 250 mg via ORAL
  Filled 2014-08-08 (×5): qty 1

## 2014-08-08 MED ORDER — DOXYCYCLINE HYCLATE 100 MG PO TABS
100.0000 mg | ORAL_TABLET | Freq: Two times a day (BID) | ORAL | Status: DC
  Administered 2014-08-08 – 2014-08-09 (×3): 100 mg via ORAL
  Filled 2014-08-08 (×5): qty 1

## 2014-08-08 MED ORDER — CIPROFLOXACIN HCL 500 MG PO TABS
500.0000 mg | ORAL_TABLET | Freq: Two times a day (BID) | ORAL | Status: DC
  Administered 2014-08-08 – 2014-08-09 (×3): 500 mg via ORAL
  Filled 2014-08-08 (×6): qty 1

## 2014-08-08 NOTE — Progress Notes (Signed)
ANTIBIOTIC CONSULT NOTE - Follow Up  Pharmacy Consult for Vancomycin and Aztreonam Indication: Cellulitis  Allergies  Allergen Reactions  . Erythromycin Nausea And Vomiting  . Penicillins Other (See Comments)    Makes dizzy    Patient Measurements: Height:  (132.1 cm) Weight: 162 lb 4.8 oz (73.619 kg) IBW/kg (Calculated) : 27.1  Vital Signs: Temp: 98.3 F (36.8 C) (08/07 0514) Temp Source: Oral (08/07 0514) BP: 132/52 mmHg (08/07 0514) Pulse Rate: 72 (08/07 0514) Intake/Output from previous day: 08/06 0701 - 08/07 0700 In: 360 [P.O.:360] Out: 250 [Urine:250] Intake/Output from this shift:    Labs:  Recent Labs  08/06/14 0418 08/06/14 1415 08/07/14 0357  WBC 5.4 2.6* 2.0*  HGB 10.8* 11.1* 10.3*  PLT 248 211 181  CREATININE 1.15* 0.85  --    Estimated Creatinine Clearance: 36.2 mL/min (by C-G formula based on Cr of 0.85).  Recent Labs  08/08/14 0700  VANCOTROUGH 15     Microbiology: Recent Results (from the past 720 hour(s))  Culture, blood (routine x 2)     Status: None (Preliminary result)   Collection Time: 08/06/14  5:35 AM  Result Value Ref Range Status   Specimen Description BLOOD LEFT HAND  Final   Special Requests BOTTLES DRAWN AEROBIC ONLY 5CC  Final   Culture   Final    NO GROWTH 1 DAY Performed at [facility]    Report Status PENDING  Incomplete  Culture, blood (routine x 2)     Status: None (Preliminary result)   Collection Time: 08/06/14  5:39 AM  Result Value Ref Range Status   Specimen Description BLOOD LEFT ANTECUBITAL  Final   Special Requests BOTTLES DRAWN AEROBIC ONLY 5CC  Final   Culture   Final    NO GROWTH 1 DAY Performed at [facility]    Report Status PENDING  Incomplete    Medical History: Past Medical History  Diagnosis Date  . S/P colostomy 2008  . Shingles     Medications:  Scheduled:  . aztreonam  1 g Intravenous Q8H  . enoxaparin (LOVENOX) injection  40 mg Subcutaneous Q24H  .  lidocaine  1 patch Transdermal Q24H  . vancomycin  750 mg Intravenous Q12H  . vitamin B-12  1,000 mcg Oral Daily   Infusions:    PRN: ketorolac, ondansetron **OR** ondansetron (ZOFRAN) IV  Assessment: 81 y.o. female with history of large ventral hernia and previous history of bowel obstruction status post colostomy bag placement was not been to a physician for many years was brought to the ER after patient had a fall. Chest x-ray reveals right-sided rib fractures. On exam patient's both lower extremity is edematous with erythema and discharge.  X-ray of the legs show arthritic changes and soft tissue swelling only  Today, 08/08/2014: Afeb, WBC low, SCr 0.85, CrCl 57 ml/min (normalized)   8/5 Clinda x 1  8/5 >> Vanc >>  8/5 >> Aztreo >>   Micro: 8/5 blood x 2: ngtd  8/5 UA: pyruria, nitrate (+)   Dose adjustments/levels:  8/7 0700 VT: 15 mcg/ml on 750 mg q12h dosing (therapeutic)  Goal of Therapy:  Vancomycin trough level 10-15 mcg/ml  Antibiotic doses appropriate for indication and renal function  Plan:   Continue Vancomycin  IV q12h Continue Aztreonam 1g IV q8h Follow up renal function & cultures Narrow spectrum as clinically appropriate  Clance Boll, PharmD, BCPS Pager: [phone] 08/08/2014 8:37 AM

## 2014-08-08 NOTE — Progress Notes (Signed)
Utilization review completed.  

## 2014-08-08 NOTE — Evaluation (Signed)
Physical Therapy Evaluation Patient Details Name: [name] MRN: [id number] DOB: [birth year] Today's Date: 08/08/2014   History of Present Illness  [name] is a 81 y.o. female with history of large ventral hernia and previous history of bowel obstruction status post colostomy bag placement brought to the ER after patient had a fall. Chest x-ray reveals right-sided rib fractures. Both lower extremity is edematous with erythema and discharge. Patient has been applying some local spices on it.   Clinical Impression  Pt admitted with above diagnosis. Pt currently with functional limitations due to the deficits listed below (see PT Problem List). Pt will benefit from skilled PT to increase their independence and safety with mobility to allow discharge to the venue listed below.  Pt would benefit from SNF rehab after d/c from acute care to work towards returning to [facility] which pt reports was ambulating with RW I'ly. Pt sat EOB with MAX of 1, but would recommend +2, for any attempts OOB.     Follow Up Recommendations SNF    Equipment Recommendations  None recommended by PT    Recommendations for Other Services       Precautions / Restrictions Precautions Precautions: Fall Precaution Comments: R rib fxs Restrictions Weight Bearing Restrictions: No      Mobility  Bed Mobility Overal bed mobility: Needs Assistance Bed Mobility: Supine to Sit;Sit to Supine;Rolling Rolling: Total assist   Supine to sit: Max assist Sit to supine: Max assist   General bed mobility comments: Pt transferred to EOB with heavy use of pads under legs and then A to get trunk upright.  Pt requiring many breaks during transfer.  Pt sat EOB for several minutes with lean to the left but no physical A before returning supine with MAX A for legs and pt managing trunk.  Re-positioned with MAX A with use of pads.  Transfers                 General transfer comment: NT  Ambulation/Gait             General Gait Details: NT  Stairs            Wheelchair Mobility    Modified Rankin (Stroke Patients Only)       Balance Overall balance assessment: Needs assistance   Sitting balance-Leahy Scale: Fair   Postural control: Left lateral lean                                   Pertinent Vitals/Pain Pain Assessment: 0-10 Pain Score: 10-Worst pain ever Pain Location: R rib pain Pain Descriptors / Indicators: Other (Comment) ("it just hurts period") Pain Intervention(s): Limited activity within patient's tolerance    Home Living Family/patient expects to be discharged to:: Skilled nursing facility Living Arrangements: Alone                    Prior Function Level of Independence: Independent with assistive device(s)         Comments: I with RW     Hand Dominance        Extremity/Trunk Assessment               Lower Extremity Assessment: RLE deficits/detail;LLE deficits/detail RLE Deficits / Details: Limited strength and ROM due to cellulitis pain LLE Deficits / Details: Limited strength and ROM due to cellulitis pain, but moves better than R LE  Communication   Communication: No difficulties  Cognition Arousal/Alertness: Awake/alert Behavior During Therapy: WFL for tasks assessed/performed Overall Cognitive Status: No family/caregiver present to determine baseline cognitive functioning (cues to stay on task)                      General Comments      Exercises General Exercises - Lower Extremity Ankle Circles/Pumps: AROM;Both;20 reps;Supine Heel Slides: AAROM;Both;10 reps;Supine Hip ABduction/ADduction: AAROM;Both;10 reps;Supine      Assessment/Plan    PT Assessment Patient needs continued PT services  PT Diagnosis Generalized weakness   PT Problem List Decreased strength;Decreased range of motion;Decreased balance;Decreased activity tolerance;Decreased mobility;Decreased skin integrity  PT  Treatment Interventions DME instruction;Balance training;Functional mobility training;Therapeutic activities;Therapeutic exercise   PT Goals (Current goals can be found in the Care Plan section) Acute Rehab PT Goals Patient Stated Goal: less pain PT Goal Formulation: With patient Time For Goal Achievement: 08/22/14 Potential to Achieve Goals: Fair    Frequency Min 3X/week   Barriers to discharge        Co-evaluation               End of Session   Activity Tolerance: Patient limited by fatigue Patient left: in bed;with call bell/phone within reach Nurse Communication: Mobility status         Time: 1610-9604 PT Time Calculation (min) (ACUTE ONLY): 38 min   Charges:   PT Evaluation $Initial PT Evaluation Tier I: 1 Procedure PT Treatments $Therapeutic Exercise: 8-22 mins $Therapeutic Activity: 8-22 mins   PT G Codes:        Ryley Teater LUBECK 08/08/2014, 12:40 PM

## 2014-08-08 NOTE — Progress Notes (Addendum)
[facility] PROGRESS NOTE  Assessment/Plan: Cellulitis of both lower extremities - Empirically on vancomycin and aztreonam, de-esclate antibiotics to cipro and flagyl, start florastor. - Wound care following. - PT consult.  Acute renal failure: - Resolved with IV hydration.  Rib fracture: - on IV Toradol.  S/P colostomy due to Ventral hernia  Large ventral hernia with history of bowel obstruction status post colostomy Monitor clinically.  Code Status: full Family Communication: none at bedside  Disposition Plan: SNF in am   Consultants:  Wound care  Procedures:  Doppler ext  Antibiotics:  Vancomycin and aztreonam 8/5  HPI/Subjective: She relates she feels better.  Objective: Filed Vitals:   08/07/14 0447 08/07/14 1401 08/07/14 2002 08/08/14 0514  BP: 126/60 137/41 119/44 132/52  Pulse: 91 109 100 72  Temp: 99.3 F (37.4 C) 98.8 F (37.1 C) 98.2 F (36.8 C) 98.3 F (36.8 C)  TempSrc: Oral Oral Oral Oral  Resp: 16 16 22 18   Height:      Weight:      SpO2: 94% 93% 92% 92%    Intake/Output Summary (Last 24 hours) at 08/08/14 0956 Last data filed at 08/08/14 0643  Gross per 24 hour  Intake    120 ml  Output    200 ml  Net    -80 ml   Filed Weights   08/06/14 0711  Weight: 73.619 kg (162 lb 4.8 oz)    Exam:  General: Alert, awake, oriented x3, in no acute distress.  HEENT: No bruits, no goiter.  Heart: Regular rate and rhythm. Lungs: Good air movement, clear Abdomen: Soft, nontender, nondistended, positive bowel sounds.  Neuro: Grossly intact, nonfocal.   Data Reviewed: Basic Metabolic Panel:  Recent Labs Lab 08/06/14 0418 08/06/14 1415  NA 137 138  K 4.7 4.9  CL 108 107  CO2 20* 22  GLUCOSE 139* 124*  BUN 34* 28*  CREATININE 1.15* 0.85  CALCIUM 8.4* 8.0*   Liver Function Tests:  Recent Labs Lab 08/06/14 0418 08/06/14 1415  AST 27 45*  ALT 23 24  ALKPHOS 84 75  BILITOT 0.5 1.0  PROT 6.1* 5.7*  ALBUMIN 2.8*  2.5*   No results for input(s): LIPASE, AMYLASE in the last 168 hours. No results for input(s): AMMONIA in the last 168 hours. CBC:  Recent Labs Lab 08/06/14 0418 08/06/14 1415 08/07/14 0357  WBC 5.4 2.6* 2.0*  NEUTROABS 3.7 1.0*  --   HGB 10.8* 11.1* 10.3*  HCT 32.8* 33.3* 31.6*  MCV 98.8 98.5 96.9  PLT 248 211 181   Cardiac Enzymes: No results for input(s): CKTOTAL, CKMB, CKMBINDEX, TROPONINI in the last 168 hours. BNP (last 3 results) No results for input(s): BNP in the last 8760 hours.  ProBNP (last 3 results) No results for input(s): PROBNP in the last 8760 hours.  CBG: No results for input(s): GLUCAP in the last 168 hours.  Recent Results (from the past 240 hour(s))  Culture, blood (routine x 2)     Status: None (Preliminary result)   Collection Time: 08/06/14  5:35 AM  Result Value Ref Range Status   Specimen Description BLOOD LEFT HAND  Final   Special Requests BOTTLES DRAWN AEROBIC ONLY 5CC  Final   Culture   Final    NO GROWTH 1 DAY Performed at [facility]    Report Status PENDING  Incomplete  Culture, blood (routine x 2)     Status: None (Preliminary result)   Collection Time: 08/06/14  5:39 AM  Result Value Ref Range Status   Specimen Description BLOOD LEFT ANTECUBITAL  Final   Special Requests BOTTLES DRAWN AEROBIC ONLY 5CC  Final   Culture   Final    NO GROWTH 1 DAY Performed at [facility]    Report Status PENDING  Incomplete     Studies: No results found.  Scheduled Meds: . aztreonam  1 g Intravenous Q8H  . enoxaparin (LOVENOX) injection  40 mg Subcutaneous Q24H  . lidocaine  1 patch Transdermal Q24H  . vancomycin  750 mg Intravenous Q12H  . vitamin B-12  1,000 mcg Oral Daily   Continuous Infusions:   Time Spent: 15 min   [name]  [facility] Pager [phone]. If 7PM-7AM, please contact night-coverage at [email or web address], password [facility] 08/08/2014, 9:56 AM  LOS: 2 days

## 2014-08-08 NOTE — Clinical Social Work Note (Signed)
Clinical Social Work Assessment  Patient Details  Name: [name] MRN: [id number] Date of Birth: 03-Dec-1928  Date of referral:  08/08/14               Reason for consult:  Facility Placement                Permission sought to share information with:  Facility Art therapist granted to share information::  Yes, Verbal Permission Granted  Name::        Agency::     Relationship::     Contact Information:     Housing/Transportation Living arrangements for the past 2 months:  Single Family Home Source of Information:  Patient Patient Interpreter Needed:  None Criminal Activity/Legal Involvement Pertinent to Current Situation/Hospitalization:    Significant Relationships:    Lives with:  Friends Do you feel safe going back to the place where you live?  No Need for family participation in patient care:  No (Coment)  Care giving concerns:  No caregiver   Facilities manager / plan:  CSW met with pt at bedside to discuss discharge needs.  CSW explained role and prompted pt to discuss her history and current needs.  CSW explained SNF protocol and encouraged pt to explore her thoughts and feelings related to rehab at discharge.  CSW will send pt information to SNF in [place] for discharge tomorrow   Employment status:  Retired Forensic scientist:  Medicare PT Recommendations:  [place] / Referral to community resources:     Patient/Family's Response to care:  Pt stated that she lives alone and that she "has enough sense to know that" she needs rehab.  Pt stated that she does not like to take pain pills and she keeps her pain under control by keeping her self busy with TV channel [employer] fox to be exact.  Pt stated that all of her family is in [place] and she has one friend toni apple who she can utilize for support.  Pt has no history of rehab but will agree to go at discharge.  Pt stated that she will pay extra to have a  phone and tv in her room at SNF  Patient/Family's Understanding of and Emotional Response to Diagnosis, Current Treatment, and Prognosis:  Pt appears to understand that she needs to go to rehab from hospital to remain safe in her community.  Pt's mood appeared upbeat and positive.  Emotional Assessment Appearance:  Appears stated age Attitude/Demeanor/Rapport:   (cooperative) Affect (typically observed):  Accepting Orientation:  Oriented to Self, Oriented to Place, Oriented to  Time, Oriented to Situation Alcohol / Substance use:    Psych involvement (Current and /or in the community):     Discharge Needs  Concerns to be addressed:    Readmission within the last 30 days:    Current discharge risk:    Barriers to Discharge:  No Barriers Identified   Carlean Jews, LCSW 08/08/2014, 4:36 PM

## 2014-08-09 DIAGNOSIS — N179 Acute kidney failure, unspecified: Secondary | ICD-10-CM

## 2014-08-09 DIAGNOSIS — D72819 Decreased white blood cell count, unspecified: Secondary | ICD-10-CM

## 2014-08-09 DIAGNOSIS — D649 Anemia, unspecified: Secondary | ICD-10-CM

## 2014-08-09 MED ORDER — LIDOCAINE 5 % EX PTCH: 1.0000 | MEDICATED_PATCH | CUTANEOUS | Status: AC

## 2014-08-09 MED ORDER — TRAMADOL HCL 50 MG PO TABS
50.0000 mg | ORAL_TABLET | Freq: Once | ORAL | Status: AC
  Administered 2014-08-09: 50 mg via ORAL
  Filled 2014-08-09: qty 1

## 2014-08-09 MED ORDER — DOXYCYCLINE HYCLATE 100 MG PO TABS: 100.0000 mg | ORAL_TABLET | Freq: Two times a day (BID) | ORAL | Status: DC

## 2014-08-09 NOTE — Care Management Important Message (Signed)
Important Message  Patient Details  Name: [name] MRN: [id number] Date of Birth: 06/01/1928   Medicare Important Message Given:  Yes-second notification given    [name] 08/09/2014, 12:10 PMImportant Message  Patient Details  Name: [name] MRN: [id number] Date of Birth: [birth year]   Medicare Important Message Given:  Yes-second notification given    [name] 08/09/2014, 12:10 PM

## 2014-08-09 NOTE — Clinical Social Work Placement (Addendum)
   CLINICAL SOCIAL WORK PLACEMENT  NOTE  Date:  08/09/2014  Patient Details  Name: [name] MRN: [id number] Date of Birth: [birth year]  Clinical Social Work is seeking post-discharge placement for this patient at the Skilled  Nursing Facility level of care (*CSW will initial, date and re-position this form in  chart as items are completed):  Yes   Patient/family provided with [facility] Clinical Social Work Department's list of facilities offering this level of care within the geographic area requested by the patient (or if unable, by the patient's family).  Yes   Patient/family informed of their freedom to choose among providers that offer the needed level of care, that participate in Medicare, Medicaid or managed care program needed by the patient, have an available bed and are willing to accept the patient.  Yes   Patient/family informed of [facility]'s ownership interest in [facility] and [facility], as well as of the fact that they are under no obligation to receive care at these facilities.  PASRR submitted to EDS on 08/08/14     PASRR number received on 08/08/14     Existing PASRR number confirmed on       FL2 transmitted to all facilities in geographic area requested by pt/family on 08/08/14     FL2 transmitted to all facilities within larger geographic area on       Patient informed that his/her managed care company has contracts with or will negotiate with certain facilities, including the following:        Yes   Patient/family informed of bed offers received.  Patient chooses bed at [facility]     Physician recommends and patient chooses bed at      Patient to be transferred to [facility] on 08/09/14.  Patient to be transferred to facility by ambulance Sharin Mons)     Patient family notified on 08/09/14 of transfer.  Name of family member notified:  pt notified at bedside. Pt declined offer to contact family members.  Pt friend at bedside notified.      PHYSICIAN Please sign FL2     Additional Comment:    _______________________________________________ Loletta Specter A, LCSW 08/09/2014, 11:10 AM

## 2014-08-09 NOTE — Progress Notes (Signed)
Pt for discharge to [facility].  CSW facilitated pt discharge needs including contacting facility, faxing pt discharge information via [facility], discussing with pt at bedside, providing RN phone number to call report, and arranging ambulance transport for pt to [facility].   Pt anxious about transfer to SNF, but states that her friends are assisting her with getting her belongings, etc. CSW provided support.  No further social work needs identified at this time.  CSW signing off.   Loletta Specter, MSW, LCSW Clinical Social Work [phone]

## 2014-08-09 NOTE — Progress Notes (Signed)
Physical Therapy Treatment Patient Details Name: [name] MRN: [id number] DOB: [birth year] Today's Date: 08/09/2014    History of Present Illness VENITA SENG is a 81 y.o. female with history of large ventral hernia and previous history of bowel obstruction status post colostomy bag placement brought to the ER after patient had a fall. Chest x-ray reveals right-sided rib fractures. Both lower extremity is edematous with erythema and discharge. Patient has been applying some local spices on it.     PT Comments    Pt sat on edge of bed x 5 min and stood briefly with RW, tolerance limited by pain. +2 for bed mobility. R rib fx pain limits activity tolerance. Instructed pt in isometric LE exercises and performed AAROM BLEs.  Follow Up Recommendations  SNF     Equipment Recommendations  None recommended by PT    Recommendations for Other Services       Precautions / Restrictions Precautions Precautions: Fall Precaution Comments: R rib fxs Restrictions Weight Bearing Restrictions: No    Mobility  Bed Mobility Overal bed mobility: Needs Assistance Bed Mobility: Supine to Sit;Sit to Supine;Rolling     Supine to sit: Max assist;+2 for physical assistance Sit to supine: Max assist;+2 for physical assistance   General bed mobility comments: Pt transferred to EOB with heavy use of pads under legs and then A to get trunk upright.  Pt requiring many breaks during transfer.  Pt sat EOB for several minutes with lean to the left but no physical A.  She was able to prop on LUE to come to neutral sitting position before returning supine with MAX A for legs and managing trunk.  Re-positioned with MAX A with use of pads.  Transfers Overall transfer level: Needs assistance Equipment used: Rolling walker (2 wheeled) Transfers: Sit to/from Stand Sit to Stand: +2 physical assistance;Mod assist         General transfer comment: pt stood briefly with flexed trunk, tolerance limited  by pain  Ambulation/Gait             General Gait Details: NT   Stairs            Wheelchair Mobility    Modified Rankin (Stroke Patients Only)       Balance                                    Cognition Arousal/Alertness: Awake/alert Behavior During Therapy: WFL for tasks assessed/performed;Anxious Overall Cognitive Status: No family/caregiver present to determine baseline cognitive functioning (cues to stay on task)                      Exercises General Exercises - Lower Extremity Ankle Circles/Pumps: AROM;Both;20 reps;Supine Quad Sets: AROM;Both;10 reps;Supine Gluteal Sets: AROM;Both;10 reps;Supine Heel Slides: AAROM;Both;10 reps;Supine Hip ABduction/ADduction: AAROM;Both;10 reps;Supine    General Comments        Pertinent Vitals/Pain Pain Assessment: Faces Faces Pain Scale: Hurts even more (asked pt to rate pain multiple times, but she did not respond) Pain Location: R rib pain with movement, 0/10 at rest Pain Descriptors / Indicators: Sore Pain Intervention(s): Limited activity within patient's tolerance;Monitored during session;RN gave pain meds during session    Home Living                      Prior Function  PT Goals (current goals can now be found in the care plan section) Acute Rehab PT Goals Patient Stated Goal: less pain, get stronger PT Goal Formulation: With patient Time For Goal Achievement: 08/22/14 Potential to Achieve Goals: Fair Progress towards PT goals: Progressing toward goals    Frequency  Min 3X/week    PT Plan Current plan remains appropriate    Co-evaluation             End of Session   Activity Tolerance: Patient limited by fatigue;No increased pain Patient left: in bed;with call bell/phone within reach     Time: 1139-1214 PT Time Calculation (min) (ACUTE ONLY): 35 min  Charges:  $Therapeutic Exercise: 8-22 mins $Therapeutic Activity: 8-22 mins                     G Codes:      Tamala Ser 08/09/2014, 12:47 PM [phone]

## 2014-08-09 NOTE — Discharge Summary (Addendum)
Physician Discharge Summary  [name]:[id number] DOB: 04/07/1928 DOA: 08/06/2014  PCP: Paulino Rily, MD  Admit date: 08/06/2014 Discharge date: 08/09/2014  Time spent: 35 minutes  Recommendations for Outpatient Follow-up:  1. Follow up with her primary care doctor in 2-4 weeks.  2. We'll go to skilled nursing facility can then placed.  Discharge Diagnoses:  Principal Problem:   Cellulitis of both lower extremities Active Problems:   Ventral hernia   S/P colostomy   Acute renal failure   Rib fracture   Leukopenia   Normocytic anemia   Discharge Condition: stable  Diet recommendation: regular  Filed Weights   08/06/14 0711  Weight: 73.619 kg (162 lb 4.8 oz)    History of present illness:  81 year old female with large ventral hernia status post colostomy bag placement was brought in to the ER by family for fall. Chest x-ray showed right-sided fractures. Lower extremities were edematous with serosanguineous discharge for the last 3 weeks.   Hospital Course:  Lower extremity bilateral cellulitis: - Due to her penicillin allergy she was started empirically on admission on vancomycin and aztreonam. - Wound care was consulted who recommended topical care and cleansing agents, they also recommended Unna boot application twice a week. Her antibiotic regimen was D escalated to doxycycline which she will continue for 7 additional days. PT evaluated the patient and recommendations were to skilled nursing facility due to severe deconditioning. And the need for wound care dressing and Unna boots changes.   Acute kidney injury: Likely prerenal this resolved with IV hydration.  Rib fracture: She was placed on IV Toradol which she relates she did not need it.  Procedures:  Pelvis x-ray, right-sided rib x-ray, tibial x-ray lower extremity Doppler that was negative for DVT.  Consultations:  WOC  Discharge Exam: Filed Vitals:   08/09/14 0500  BP: 112/56  Pulse:    Temp:   Resp:     General: Awake alert and oriented 3 Cardiovascular: Regular rate and rhythm Respiratory: Good air movement and clear to auscultation.  Discharge Instructions   Discharge Instructions    Diet - low sodium heart healthy    Complete by:  As directed      Increase activity slowly    Complete by:  As directed           Current Discharge Medication List    START taking these medications   Details  doxycycline (VIBRA-TABS) 100 MG tablet Take 1 tablet (100 mg total) by mouth every 12 (twelve) hours. Qty: 14 tablet, Refills: 0    lidocaine (LIDODERM) 5 % Place 1 patch onto the skin daily. Remove & Discard patch within 12 hours or as directed by MD Qty: 30 patch, Refills: 0      CONTINUE these medications which have NOT CHANGED   Details  co-enzyme Q-10 30 MG capsule Take 30 mg by mouth daily.      OIL OF OREGANO PO Take 10 mLs by mouth daily.    Probiotic Product (PROBIOTIC FORMULA PO) Take 1 capsule by mouth daily.      vitamin B-12 (CYANOCOBALAMIN) 1000 MCG tablet Take 1,000 mcg by mouth daily.    vitamin C (ASCORBIC ACID) 500 MG tablet Take 1,000 mg by mouth daily.       Allergies  Allergen Reactions  . Erythromycin Nausea And Vomiting  . Penicillins Other (See Comments)    Makes dizzy      The results of significant diagnostics from this hospitalization (including imaging, microbiology, ancillary  and laboratory) are listed below for reference.    Significant Diagnostic Studies: Dg Ribs Unilateral W/chest Right  08/06/2014   CLINICAL DATA:  Tripped and fall over walker.  EXAM: RIGHT RIBS AND CHEST - 3+ VIEW  COMPARISON:  None.  FINDINGS: Acute RIGHT posterior mildly displaced fourth through eighth rib fractures. Patient is osteopenic.  No pneumothorax. Mildly elevated RIGHT hemidiaphragm with strandy densities. LEFT lung base atelectasis/scarring. Cardiomediastinal silhouette is nonsuspicious.  IMPRESSION: Acute mildly displaced RIGHT posterior  fourth through eighth rib fractures. No pneumothorax. RIGHT lung base atelectasis or possible contusion.  LEFT lung base atelectasis/ scarring.   Electronically Signed   By: Awilda Metro M.D.   On: 08/06/2014 05:37   Dg Tibia/fibula Left  08/06/2014   CLINICAL DATA:  Bilateral tib-fib pain.  Cellulitis.  EXAM: LEFT TIBIA AND FIBULA - 2 VIEW  COMPARISON:  None.  FINDINGS: Degenerative changes within the left knee. Diffuse soft tissue swelling. No fracture, subluxation or dislocation. No radiopaque foreign bodies.  IMPRESSION: Diffuse soft tissue swelling.  No acute bony abnormality.   Electronically Signed   By: Charlett Nose M.D.   On: 08/06/2014 10:12   Dg Tibia/fibula Right  08/06/2014   CLINICAL DATA:  Bilateral tibial/fibula pain.  Cellulitis.  EXAM: RIGHT TIBIA AND FIBULA - 2 VIEW  COMPARISON:  None.  FINDINGS: There is diffuse soft tissue swelling in the visualized right lower extremity. No fracture, suspicious focal osseous lesion, periosteal reaction or focal cortical erosions are seen in the right tibia or right fibula. Vascular calcifications are noted in the soft tissues. Degenerative changes are present at the right knee and right ankle joints. There is a small plantar right calcaneal spur. No malalignment is seen at the right knee or right ankle on the provided views.  IMPRESSION: Diffuse soft tissue swelling. No radiographic evidence of osteomyelitis.   Electronically Signed   By: Delbert Phenix M.D.   On: 08/06/2014 10:17   Dg Pelvis Portable  08/06/2014   CLINICAL DATA:  Larey Seat at home today, pelvic pain  EXAM: PORTABLE PELVIS 1-2 VIEWS  COMPARISON:  Portable exam 0906 hours compared to CT of 06/29/2010  FINDINGS: Diffuse osseous demineralization.  Hip and SI joint spaces grossly normal.  No acute fracture, dislocation or bone destruction.  Degenerative disc disease changes at poorly visualized lower lumbar spine.  Ostomy LEFT lower quadrant.  IMPRESSION: No definite acute osseous  abnormalities.  Osseous demineralization with degenerative disc disease changes at visualized lower lumbar spine.   Electronically Signed   By: Ulyses Southward M.D.   On: 08/06/2014 10:10    Microbiology: Recent Results (from the past 240 hour(s))  Culture, blood (routine x 2)     Status: None (Preliminary result)   Collection Time: 08/06/14  5:35 AM  Result Value Ref Range Status   Specimen Description BLOOD LEFT HAND  Final   Special Requests BOTTLES DRAWN AEROBIC ONLY 5CC  Final   Culture   Final    NO GROWTH 2 DAYS Performed at [facility]    Report Status PENDING  Incomplete  Culture, blood (routine x 2)     Status: None (Preliminary result)   Collection Time: 08/06/14  5:39 AM  Result Value Ref Range Status   Specimen Description BLOOD LEFT ANTECUBITAL  Final   Special Requests BOTTLES DRAWN AEROBIC ONLY 5CC  Final   Culture   Final    NO GROWTH 2 DAYS Performed at [facility]    Report Status PENDING  Incomplete     Labs: Basic Metabolic Panel:  Recent Labs Lab 08/06/14 0418 08/06/14 1415  NA 137 138  K 4.7 4.9  CL 108 107  CO2 20* 22  GLUCOSE 139* 124*  BUN 34* 28*  CREATININE 1.15* 0.85  CALCIUM 8.4* 8.0*   Liver Function Tests:  Recent Labs Lab 08/06/14 0418 08/06/14 1415  AST 27 45*  ALT 23 24  ALKPHOS 84 75  BILITOT 0.5 1.0  PROT 6.1* 5.7*  ALBUMIN 2.8* 2.5*   No results for input(s): LIPASE, AMYLASE in the last 168 hours. No results for input(s): AMMONIA in the last 168 hours. CBC:  Recent Labs Lab 08/06/14 0418 08/06/14 1415 08/07/14 0357  WBC 5.4 2.6* 2.0*  NEUTROABS 3.7 1.0*  --   HGB 10.8* 11.1* 10.3*  HCT 32.8* 33.3* 31.6*  MCV 98.8 98.5 96.9  PLT 248 211 181   Cardiac Enzymes: No results for input(s): CKTOTAL, CKMB, CKMBINDEX, TROPONINI in the last 168 hours. BNP: BNP (last 3 results) No results for input(s): BNP in the last 8760 hours.  ProBNP (last 3 results) No results for input(s): PROBNP in the last  8760 hours.  CBG: No results for input(s): GLUCAP in the last 168 hours.     Signed:  Marinda Elk  [facility] 08/09/2014, 11:05 AM

## 2014-08-09 NOTE — Progress Notes (Signed)
Patient discharged to SNF, copies of discharge medications and instructions sent to facility. Patient to be transported via [facility].  

## 2014-08-09 NOTE — Progress Notes (Signed)
CSW continuing to follow.   Per MD, pt medically stable for discharge.  CSW visited pt bedside to discuss SNF bed offers. CSW provided SNF bed offers. Pt expressed being overwhelmed as she did not yet feel that she was medically ready for discharge. CSW provided support and explained that MD has discharged pt as pt does not meet further medical acuity in the hospital. CSW explained to pt that SNF can continue pt wound care and physical therapy.   CSW reviewed bed offers with pt and pt chooses bed at [facility].  CSW contacted facility and notified facility of pt acceptance of bed offer. [facility] confirmed bed availability for today.   CSW to facilitate pt discharge needs to [facility] this afternoon.  Loletta Specter, MSW, LCSW Clinical Social Work [phone]

## 2014-08-10 ENCOUNTER — Non-Acute Institutional Stay (SKILLED_NURSING_FACILITY): Payer: Medicare Other | Admitting: Internal Medicine

## 2014-08-10 ENCOUNTER — Encounter: Payer: Self-pay | Admitting: Internal Medicine

## 2014-08-10 DIAGNOSIS — D649 Anemia, unspecified: Secondary | ICD-10-CM

## 2014-08-10 DIAGNOSIS — L03115 Cellulitis of right lower limb: Secondary | ICD-10-CM | POA: Diagnosis not present

## 2014-08-10 DIAGNOSIS — L03116 Cellulitis of left lower limb: Secondary | ICD-10-CM | POA: Diagnosis not present

## 2014-08-10 DIAGNOSIS — S2231XA Fracture of one rib, right side, initial encounter for closed fracture: Secondary | ICD-10-CM

## 2014-08-10 NOTE — Progress Notes (Signed)
MRN: [id number] Name: [name]  Sex: female Age: 81 y.o. DOB: 1928-02-20  [facility] #: [facility] Facility/Room:114 Level Of Care: SNF Provider: Merrilee Seashore D Emergency Contacts: Extended Emergency Contact Information Primary Emergency Contact: Smith,Peggy Address: [address]          [place] [address] [place] Home Phone: [phone] Relation: Friend Secondary Emergency Contact: Layne Benton [place] Home Phone: [phone] Relation: Friend  Code Status: FULL  Allergies: Erythromycin and Penicillins  Chief Complaint  Patient presents with  . New Admit To SNF    HPI: Patient is 81 y.o. female with large ventral hernia status post colostomy bag placement was brought in to the ER by family for fall. Chest x-ray showed right-sided fractures. Lower extremities were edematous with serosanguineous discharge for the last 3 weeks. Pt was admitted from 8/5-8 for BLE cellulitis. Wounds are  insufficiency ulcers, multiple each leg, draining clearish fluid, had not improved with pt's home remedies, no fevers or nausea/vomiting. They were tx in hospital with IV abx, then doxy po for 7 days after d/c to SNF.  Course was complicated by rib fractures on R and AKI.  Pt is d/c to SNF for very active, ongoing wound care and for severe deconditioning for OT/PT. While at SNF pt will be treated for anemia with B12  supplement, and CP with lidocaine patch.  Past Medical History  Diagnosis Date  . S/P colostomy 2008  . Shingles     Past Surgical History  Procedure Laterality Date  . Colostomy        Medication List       This list is accurate as of: 08/10/14 11:59 PM.  Always use your most recent med list.               co-enzyme Q-10 30 MG capsule  Take 30 mg by mouth daily.     doxycycline 100 MG tablet  Commonly known as:  VIBRA-TABS  Take 1 tablet (100 mg total) by mouth every 12 (twelve) hours.     lidocaine 5 %  Commonly  known as:  LIDODERM  Place 1 patch onto the skin daily. Remove & Discard patch within 12 hours or as directed by MD     OIL OF OREGANO PO  Take 10 mLs by mouth daily.     PROBIOTIC FORMULA PO  Take 1 capsule by mouth daily.     vitamin B-12 1000 MCG tablet  Commonly known as:  CYANOCOBALAMIN  Take 1,000 mcg by mouth daily.     vitamin C 500 MG tablet  Commonly known as:  ASCORBIC ACID  Take 1,000 mg by mouth daily.        No orders of the defined types were placed in this encounter.     There is no immunization history on file for this patient.  Social History  Substance Use Topics  . Smoking status: Never Smoker   . Smokeless tobacco: Not on file  . Alcohol Use: No    Family history is + CAD    Review of Systems  DATA OBTAINED: from  nurse, medical record GENERAL:  no fevers, fatigue, appetite changes SKIN: No itching, rash ;wounds per HPI EYES: No eye pain, redness, discharge EARS: No earache, tinnitus, change in hearing NOSE: No congestion, drainage or bleeding  MOUTH/THROAT: No mouth or tooth pain, No sore throat RESPIRATORY: No cough, wheezing, SOB CARDIAC: No chest pain, palpitations, lower extremity edema  GI: No abdominal pain, No N/V/D or constipation, No heartburn or reflux  GU: No dysuria, frequency or urgency, or incontinence  MUSCULOSKELETAL: No unrelieved bone/joint pain NEUROLOGIC: No headache, dizziness or focal weakness PSYCHIATRIC: No c/o anxiety or sadness   Filed Vitals:   08/10/14 1917  BP: 136/58  Pulse: 97  Temp: 98 F (36.7 C)  Resp: 18    SpO2 Readings from Last 1 Encounters:  08/09/14 95%        Physical Exam  GENERAL APPEARANCE: Alert, conversant,  No acute distress.  SKIN: No diaphoresis rash; BLE dressed with curlex and mild compression tape HEAD: Normocephalic, atraumatic  EYES: Conjunctiva/lids clear. Pupils round, reactive. EOMs intact.  EARS: External exam WNL, canals clear. Hearing grossly normal.  NOSE: No  deformity or discharge.  MOUTH/THROAT: Lips w/o lesions  RESPIRATORY: Breathing is even, unlabored. Lung sounds are clear   CARDIOVASCULAR: Heart RRR no murmurs, rubs or gallops. No peripheral edema.   GASTROINTESTINAL: Abdomen is soft, non-tender, not distended w/ normal bowel sounds. GENITOURINARY: Bladder non tender, not distended  MUSCULOSKELETAL: No abnormal joints or musculature NEUROLOGIC:  Cranial nerves 2-12 grossly intact. Moves all extremities  PSYCHIATRIC: Mood and affect appropriate to situation , no behavioral issues  Patient Active Problem List   Diagnosis Date Noted  . Leukopenia 08/09/2014  . Normocytic anemia 08/09/2014  . Cellulitis 08/06/2014  . Cellulitis of both lower extremities 08/06/2014  . Prerenal acute renal failure 08/06/2014  . Rib fracture 08/06/2014  . Rib fractures   . Ventral hernia 07/28/2010  . S/P colostomy 07/28/2010  . Shingles 07/28/2010    CBC    Component Value Date/Time   WBC 2.0* 08/07/2014 0357   RBC 3.26* 08/07/2014 0357   HGB 10.3* 08/07/2014 0357   HCT 31.6* 08/07/2014 0357   PLT 181 08/07/2014 0357   MCV 96.9 08/07/2014 0357   LYMPHSABS 0.6* 08/06/2014 1415   MONOABS 0.9 08/06/2014 1415   EOSABS 0.0 08/06/2014 1415   BASOSABS 0.0 08/06/2014 1415    CMP     Component Value Date/Time   NA 138 08/06/2014 1415   K 4.9 08/06/2014 1415   CL 107 08/06/2014 1415   CO2 22 08/06/2014 1415   GLUCOSE 124* 08/06/2014 1415   BUN 28* 08/06/2014 1415   CREATININE 0.85 08/06/2014 1415   CALCIUM 8.0* 08/06/2014 1415   PROT 5.7* 08/06/2014 1415   ALBUMIN 2.5* 08/06/2014 1415   AST 45* 08/06/2014 1415   ALT 24 08/06/2014 1415   ALKPHOS 75 08/06/2014 1415   BILITOT 1.0 08/06/2014 1415   GFRNONAA >60 08/06/2014 1415   GFRAA >60 08/06/2014 1415    No results found for: HGBA1C   Dg Ribs Unilateral W/chest Right  08/06/2014   CLINICAL DATA:  Tripped and fall over walker.  EXAM: RIGHT RIBS AND CHEST - 3+ VIEW  COMPARISON:  None.   FINDINGS: Acute RIGHT posterior mildly displaced fourth through eighth rib fractures. Patient is osteopenic.  No pneumothorax. Mildly elevated RIGHT hemidiaphragm with strandy densities. LEFT lung base atelectasis/scarring. Cardiomediastinal silhouette is nonsuspicious.  IMPRESSION: Acute mildly displaced RIGHT posterior fourth through eighth rib fractures. No pneumothorax. RIGHT lung base atelectasis or possible contusion.  LEFT lung base atelectasis/ scarring.   Electronically Signed   By: Awilda Metro M.D.   On: 08/06/2014 05:37   Dg Tibia/fibula Left  08/06/2014   CLINICAL DATA:  Bilateral tib-fib pain.  Cellulitis.  EXAM: LEFT TIBIA AND FIBULA - 2 VIEW  COMPARISON:  None.  FINDINGS: Degenerative  changes within the left knee. Diffuse soft tissue swelling. No fracture, subluxation or dislocation. No radiopaque foreign bodies.  IMPRESSION: Diffuse soft tissue swelling.  No acute bony abnormality.   Electronically Signed   By: Charlett Nose M.D.   On: 08/06/2014 10:12   Dg Tibia/fibula Right  08/06/2014   CLINICAL DATA:  Bilateral tibial/fibula pain.  Cellulitis.  EXAM: RIGHT TIBIA AND FIBULA - 2 VIEW  COMPARISON:  None.  FINDINGS: There is diffuse soft tissue swelling in the visualized right lower extremity. No fracture, suspicious focal osseous lesion, periosteal reaction or focal cortical erosions are seen in the right tibia or right fibula. Vascular calcifications are noted in the soft tissues. Degenerative changes are present at the right knee and right ankle joints. There is a small plantar right calcaneal spur. No malalignment is seen at the right knee or right ankle on the provided views.  IMPRESSION: Diffuse soft tissue swelling. No radiographic evidence of osteomyelitis.   Electronically Signed   By: Delbert Phenix M.D.   On: 08/06/2014 10:17   Dg Pelvis Portable  08/06/2014   CLINICAL DATA:  Larey Seat at home today, pelvic pain  EXAM: PORTABLE PELVIS 1-2 VIEWS  COMPARISON:  Portable exam 0906 hours  compared to CT of 06/29/2010  FINDINGS: Diffuse osseous demineralization.  Hip and SI joint spaces grossly normal.  No acute fracture, dislocation or bone destruction.  Degenerative disc disease changes at poorly visualized lower lumbar spine.  Ostomy LEFT lower quadrant.  IMPRESSION: No definite acute osseous abnormalities.  Osseous demineralization with degenerative disc disease changes at visualized lower lumbar spine.   Electronically Signed   By: Ulyses Southward M.D.   On: 08/06/2014 10:10    Not all labs, radiology exams or other studies done during hospitalization come through on my EPIC note; however they are reviewed by me.    Assessment and Plan  Cellulitis of both lower extremities Due to her penicillin allergy she was started empirically on admission on vancomycin and aztreonam. - Wound care was consulted who recommended topical care and cleansing agents, they also recommended Unna boot application twice a week. Her antibiotic regimen was D escalated to doxycycline which she will continue for 7 additional days SNF plan - cont doxy for 7 days; have decided to treat multiple wounds on each leg with local care, silver alginate, kerlex   wrap and  a mild compression wrap overlying instead of unaboot  Prerenal acute renal failure Reported resolved in hospital with IVF; SNF plan - encourage fluids; BMP to follow BUN/Cr  Rib fracture Fx of posterior rib 4-8;SNF plan - pt does not look uncomfortable;is OK now with lidocaine patch; will watch for signs or sx of PNA  Normocytic anemia SNF plan -cont B12 supp and CBC to monitor HB    Margit Hanks, MD

## 2014-08-11 ENCOUNTER — Encounter: Payer: Self-pay | Admitting: Internal Medicine

## 2014-08-11 LAB — CULTURE, BLOOD (ROUTINE X 2)
CULTURE: NO GROWTH
CULTURE: NO GROWTH

## 2014-08-11 NOTE — Assessment & Plan Note (Signed)
SNF plan -cont B12 supp and CBC to monitor HB

## 2014-08-11 NOTE — Assessment & Plan Note (Addendum)
Due to her penicillin allergy she was started empirically on admission on vancomycin and aztreonam. - Wound care was consulted who recommended topical care and cleansing agents, they also recommended Unna boot application twice a week. Her antibiotic regimen was D escalated to doxycycline which she will continue for 7 additional days SNF plan - cont doxy for 7 days; have decided to treat multiple wounds on each leg with local care, silver alginate, kerlex   wrap and  a mild compression wrap overlying instead of unaboot

## 2014-08-11 NOTE — Assessment & Plan Note (Signed)
Reported resolved in hospital with IVF; SNF plan - encourage fluids; BMP to follow BUN/Cr

## 2014-08-11 NOTE — Assessment & Plan Note (Signed)
Fx of posterior rib 4-8;SNF plan - pt does not look uncomfortable;is OK now with lidocaine patch; will watch for signs or sx of PNA

## 2014-08-19 ENCOUNTER — Encounter: Payer: Self-pay | Admitting: Internal Medicine

## 2014-08-19 ENCOUNTER — Non-Acute Institutional Stay (SKILLED_NURSING_FACILITY): Payer: Medicare Other | Admitting: Internal Medicine

## 2014-08-19 DIAGNOSIS — R443 Hallucinations, unspecified: Secondary | ICD-10-CM | POA: Diagnosis not present

## 2014-08-19 DIAGNOSIS — R52 Pain, unspecified: Secondary | ICD-10-CM

## 2014-08-19 DIAGNOSIS — R0902 Hypoxemia: Secondary | ICD-10-CM | POA: Insufficient documentation

## 2014-08-19 DIAGNOSIS — R609 Edema, unspecified: Secondary | ICD-10-CM | POA: Insufficient documentation

## 2014-08-19 NOTE — Progress Notes (Signed)
Patient ID: [name], female   DOB: 1928/09/12, 81 y.o.   MRN: [id number] MRN: [id number] Name: [name]  Sex: female Age: 81 y.o. DOB: 08-09-1928  [facility] Facility/Room:114 Level Of Care: SNF Provider: Roena Malady Emergency Contacts: Extended Emergency Contact Information Primary Emergency Contact: Smith,Peggy Address: [address]          [place] [address] [place] Home Phone: [phone] Relation: Friend Secondary Emergency Contact: Layne Benton [place] Home Phone: [phone] Relation: Friend  Code Status: FULL  Allergies: Erythromycin and Penicillins  Chief Complaint  Patient presents with  . Acute Visit   secondary to numerous issues including pain-increased anxiety-hallucinations-possible hypoxia  HPI: Patient is 81 y.o. female with large ventral hernia status post colostomy bag placement was brought in to the ER by family for fall. Chest x-ray showed right-sided fractures. Lower extremities were edematous with serosanguineous discharge for the last 3 weeks. Pt was admitted from 8/5-8 for BLE cellulitis. Wounds are  insufficiency ulcers, multiple each leg, draining clearish fluid, had not improved with pt's home remedies, no fevers or nausea/vomiting. They were tx in hospital with IV abx, then doxy po for 7 days after d/c to SNF.  Course was complicated by rib fractures on R and AKI.  Pt is d/c to SNF for very active, ongoing wound care and for severe deconditioning for OT/PT Per wound care wounds are stable and progressing nicely.  She does have numerous issues including friends concerns who are visiting about possible shortness of breath anxiety-increased hallucinations apparently she is seeing things at night which don't exist.   also feels she is having some increased pain  Vital signs are notable for an O2 saturation of 89% on room air-this quickly rose into the mid 90s on 2 L of  oxygen-otherwise vital signs are stable she does have a temperature of 99.4.  She denies any fever or chills main complaint is pain apparently with movement .  Past Medical History  Diagnosis Date  . S/P colostomy 2008  . Shingles     Past Surgical History  Procedure Laterality Date  . Colostomy        Medication List       This list is accurate as of: 08/19/14 11:59 PM.  Always use your most recent med list.               co-enzyme Q-10 30 MG capsule  Take 30 mg by mouth daily.     lidocaine 5 %  Commonly known as:  LIDODERM  Place 1 patch onto the skin daily. Remove & Discard patch within 12 hours or as directed by MD     OIL OF OREGANO PO  Take 10 mLs by mouth daily.     oxycodone 5 MG capsule  Commonly known as:  OXY-IR  Take 5 mg by mouth every 4 (four) hours as needed.     PROBIOTIC FORMULA PO  Take 1 capsule by mouth daily.     vitamin B-12 1000 MCG tablet  Commonly known as:  CYANOCOBALAMIN  Take 1,000 mcg by mouth daily.     vitamin C 500 MG tablet  Commonly known as:  ASCORBIC ACID  Take 1,000 mg by mouth daily.        Meds ordered this encounter  Medications  . DISCONTD: oxycodone (OXY-IR) 5 MG capsule    Sig: Take 5 mg by mouth every 4 (four) hours as  needed.     There is no immunization history on file for this patient.  Social History  Substance Use Topics  . Smoking status: Never Smoker   . Smokeless tobacco: Not on file  . Alcohol Use: No    Family history is + CAD    Review of Systems  DATA OBTAINED: from  Nurse,  Friends at bedside medical record GENERAL:  no fevers, fatigue, appetite changes SKIN: No itching, rash ;wounds per HPI EYES: No eye pain, redness, discharge EARS: No earache, tinnitus, change in hearing NOSE: No congestion, drainage or bleeding  MOUTH/THROAT: No mouth or tooth pain, No sore throat RESPIRATORY: No cough, wheezing, some mild hypoxia she does not overtly complain of shortness of breath but her  friends at bedside feels sometimes she seems to be short of breath CARDIAC: No chest pain, palpitations, lower extremity edema  GI: No abdominal pain, No N/V/D or constipation, No heartburn or reflux  GU: No dysuria, frequency or urgency, or incontinence  MUSCULOSKELETAL: Complains of generalized pain with movement NEUROLOGIC: No headache, dizziness or focal weakness PSYCHIATRIC:  anxiety--r hallucinations per her friends   Filed Vitals:   08/19/14 2133  BP: 127/67  Pulse: 90  Temp: 99.4 F (37.4 C)  Resp: 18    SpO2 Readings from Last 1 Encounters:  08/20/14 100%        Physical Exam  GENERAL APPEARANCE: Alert, conversant,  No acute distress appears somewhat anxious.  SKIN: No diaphoresis rash; BLE dressed with curlex and mild compression tape HEAD: Normocephalic, atraumatic  EYES: Conjunctiva/lids clear. Pupils round, reactive. EOMs intact.  EARS: External exam WNL, canals clear. Hearing grossly normal.  NOSE: No deformity or discharge.  MOUTH/THROAT: Oropharynx is clear mucous membranes moist RESPIRATORY: Breathing is even, unlabored. Could not appreciate congestion sounds like decreased breath sounds on the right however  CARDIOVASCULAR: Heart RRR no murmurs, rubs or gallops. There is mild edema of her lower arms hands this possibly may be dependent.   GASTROINTESTINAL: Abdomen is soft, non-tender, not distended w/ normal bowel sounds. Has a ventral hernia and a colostomy bag noted on the left GENITOURINARY: Bladder non tender, not distended  MUSCULOSKELETAL: No abnormal joints or musculature--moves all extremities 4--she does have complaints of pain with any movement of her back or extremities--this appears to be generalized NEUROLOGIC:  Cranial nerves 2-12 grossly intact. Moves all extremities cannot appreciate any lateralizing findings PSYCHIATRIC: Is pleasant and appropriate--somewhat anxious she is oriented to self and place year day weekend month can tell me her home  address.  Her friends she does have hallucinations seeing things at night and are not in her room  Patient Active Problem List   Diagnosis Date Noted  . Edema 08/19/2014  . Pain, generalized 08/19/2014  . Oxygen desaturation 08/19/2014  . Leukopenia 08/09/2014  . Normocytic anemia 08/09/2014  . Cellulitis 08/06/2014  . Cellulitis of both lower extremities 08/06/2014  . Prerenal acute renal failure 08/06/2014  . Rib fracture 08/06/2014  . Rib fractures   . Ventral hernia 07/28/2010  . S/P colostomy 07/28/2010  . Shingles 07/28/2010    CBC    Component Value Date/Time   WBC 2.0* 08/07/2014 0357   RBC 3.26* 08/07/2014 0357   HGB 10.3* 08/07/2014 0357   HCT 31.6* 08/07/2014 0357   PLT 181 08/07/2014 0357   MCV 96.9 08/07/2014 0357   LYMPHSABS 0.6* 08/06/2014 1415   MONOABS 0.9 08/06/2014 1415   EOSABS 0.0 08/06/2014 1415   BASOSABS 0.0 08/06/2014 1415  CMP     Component Value Date/Time   NA 138 08/06/2014 1415   K 4.9 08/06/2014 1415   CL 107 08/06/2014 1415   CO2 22 08/06/2014 1415   GLUCOSE 124* 08/06/2014 1415   BUN 28* 08/06/2014 1415   CREATININE 0.85 08/06/2014 1415   CALCIUM 8.0* 08/06/2014 1415   PROT 5.7* 08/06/2014 1415   ALBUMIN 2.5* 08/06/2014 1415   AST 45* 08/06/2014 1415   ALT 24 08/06/2014 1415   ALKPHOS 75 08/06/2014 1415   BILITOT 1.0 08/06/2014 1415   GFRNONAA >60 08/06/2014 1415   GFRAA >60 08/06/2014 1415    No results found for: HGBA1C   Dg Ribs Unilateral W/chest Right  08/06/2014   CLINICAL DATA:  Tripped and fall over walker.  EXAM: RIGHT RIBS AND CHEST - 3+ VIEW  COMPARISON:  None.  FINDINGS: Acute RIGHT posterior mildly displaced fourth through eighth rib fractures. Patient is osteopenic.  No pneumothorax. Mildly elevated RIGHT hemidiaphragm with strandy densities. LEFT lung base atelectasis/scarring. Cardiomediastinal silhouette is nonsuspicious.  IMPRESSION: Acute mildly displaced RIGHT posterior fourth through eighth rib  fractures. No pneumothorax. RIGHT lung base atelectasis or possible contusion.  LEFT lung base atelectasis/ scarring.   Electronically Signed   By: Awilda Metro M.D.   On: 08/06/2014 05:37   Dg Tibia/fibula Left  08/06/2014   CLINICAL DATA:  Bilateral tib-fib pain.  Cellulitis.  EXAM: LEFT TIBIA AND FIBULA - 2 VIEW  COMPARISON:  None.  FINDINGS: Degenerative changes within the left knee. Diffuse soft tissue swelling. No fracture, subluxation or dislocation. No radiopaque foreign bodies.  IMPRESSION: Diffuse soft tissue swelling.  No acute bony abnormality.   Electronically Signed   By: Charlett Nose M.D.   On: 08/06/2014 10:12   Dg Tibia/fibula Right  08/06/2014   CLINICAL DATA:  Bilateral tibial/fibula pain.  Cellulitis.  EXAM: RIGHT TIBIA AND FIBULA - 2 VIEW  COMPARISON:  None.  FINDINGS: There is diffuse soft tissue swelling in the visualized right lower extremity. No fracture, suspicious focal osseous lesion, periosteal reaction or focal cortical erosions are seen in the right tibia or right fibula. Vascular calcifications are noted in the soft tissues. Degenerative changes are present at the right knee and right ankle joints. There is a small plantar right calcaneal spur. No malalignment is seen at the right knee or right ankle on the provided views.  IMPRESSION: Diffuse soft tissue swelling. No radiographic evidence of osteomyelitis.   Electronically Signed   By: Delbert Phenix M.D.   On: 08/06/2014 10:17   Dg Pelvis Portable  08/06/2014   CLINICAL DATA:  Larey Seat at home today, pelvic pain  EXAM: PORTABLE PELVIS 1-2 VIEWS  COMPARISON:  Portable exam 0906 hours compared to CT of 06/29/2010  FINDINGS: Diffuse osseous demineralization.  Hip and SI joint spaces grossly normal.  No acute fracture, dislocation or bone destruction.  Degenerative disc disease changes at poorly visualized lower lumbar spine.  Ostomy LEFT lower quadrant.  IMPRESSION: No definite acute osseous abnormalities.  Osseous  demineralization with degenerative disc disease changes at visualized lower lumbar spine.   Electronically Signed   By: Ulyses Southward M.D.   On: 08/06/2014 10:10    Not all labs, radiology exams or other studies done during hospitalization come through on my EPIC note; however they are reviewed by me.    Assessment and Plan  #1-history of hypoxia-we will order a chest x-ray monitor vital signs pulse ox closely every 4 hours she does not appear to be  in any distress.  Edema? Possibly slightly increased upper extremities tomorrow notify provider of any weight gain also will await reports of chest x-ray look for any possible increased pulmonary edema also will update BNP.  #3 history of generalized pain this could be contributing to her anxiety will switch her oxycodone 5 mg every 4 hours from every 6 and monitor apparently this is giving her some relief.  #4 history of hallucinations-this was discussed with the psychiatric nurse practitioner who also saw her today she has been started on low-dose Risperdal 0.25 mg twice a day at this point will monitor.  Of note also will update blood work including a CBC with differential CMP and BNP tomorrow with her history of possible edema as well as looking for any elevated white count.  [id number] note 35 minutes spent assessing patient-reviewing her chart-extensive discussion with her friends who are quite familiar with her-patient does not have family nearby-and coordinating formulating a plan of care for numerous diagnoses-of note greater than 50% of time spent coordinating plan of care    Norris, Martha C,

## 2014-08-20 ENCOUNTER — Encounter: Payer: Self-pay | Admitting: Internal Medicine

## 2014-08-20 ENCOUNTER — Non-Acute Institutional Stay (SKILLED_NURSING_FACILITY): Payer: Medicare Other | Admitting: Internal Medicine

## 2014-08-20 ENCOUNTER — Other Ambulatory Visit: Payer: Self-pay | Admitting: *Deleted

## 2014-08-20 DIAGNOSIS — R0902 Hypoxemia: Secondary | ICD-10-CM

## 2014-08-20 DIAGNOSIS — J189 Pneumonia, unspecified organism: Secondary | ICD-10-CM

## 2014-08-20 MED ORDER — OXYCODONE HCL 5 MG PO CAPS: ORAL_CAPSULE | ORAL | Status: DC

## 2014-08-20 NOTE — Telephone Encounter (Signed)
[facility] 

## 2014-08-20 NOTE — Assessment & Plan Note (Addendum)
This is the only really useful info I have. CXR not helpful, exam not particularly helpful. She has no hx CHF, HTN, heart disease and since she has had abd surgery I know she had a pre-op at one time, but no hx or meds.  Statistically pt has risk for PNA 2/2 rib fx. Not presenting like PE.  Later entry  WBC 3.0 which is normal for pt; diff 30 seg, 30 L, 30 M which is odd. Will use levaquin 500 mg daily for 7 days, CrCl calc 51, covers atypicals.

## 2014-08-20 NOTE — Progress Notes (Signed)
MRN: [id number] Name: [name]  Sex: female Age: 81 y.o. DOB: June 26, 1928  [facility] #: [facility] Facility/Room:114 Level Of Care: SNF Provider: Merrilee Seashore D Emergency Contacts: Extended Emergency Contact Information Primary Emergency Contact: Smith,Peggy Address: [address]          [place] [address] [place] Home Phone: [phone] Relation: Friend Secondary Emergency Contact: Layne Benton [place] Home Phone: [phone] Relation: Friend  Code Status:   Allergies: Erythromycin and Penicillins  Chief Complaint  Patient presents with  . Acute Visit    HPI: Patient is 81 y.o. female who is being seen today to followup on decreased O2 sat yesterday. Pt has had no cough, no fever, no mental status change and did not act in distress when O2 decreased to 89% yesterday. CXR at SNF was read as moderate pathy B densities compatible with PNA or pulmonary edema, with small B pleural effusions and mild prominence of heart size. This reading is like every other reading on a CXR here and therefore not helpful. STAT labs are not back - its been about 10 hours now.  Past Medical History  Diagnosis Date  . S/P colostomy 2008  . Shingles     Past Surgical History  Procedure Laterality Date  . Colostomy        Medication List       This list is accurate as of: 08/20/14  4:09 PM.  Always use your most recent med list.               co-enzyme Q-10 30 MG capsule  Take 30 mg by mouth daily.     lidocaine 5 %  Commonly known as:  LIDODERM  Place 1 patch onto the skin daily. Remove & Discard patch within 12 hours or as directed by MD     OIL OF OREGANO PO  Take 10 mLs by mouth daily.     oxycodone 5 MG capsule  Commonly known as:  OXY-IR  Take one tablet by mouth every 4 hours as needed for pain     PROBIOTIC FORMULA PO  Take 1 capsule by mouth daily.     vitamin B-12 1000 MCG tablet  Commonly known as:   CYANOCOBALAMIN  Take 1,000 mcg by mouth daily.     vitamin C 500 MG tablet  Commonly known as:  ASCORBIC ACID  Take 1,000 mg by mouth daily.        No orders of the defined types were placed in this encounter.     There is no immunization history on file for this patient.  Social History  Substance Use Topics  . Smoking status: Never Smoker   . Smokeless tobacco: Not on file  . Alcohol Use: No    Review of Systems  DATA OBTAINED: from patient, nurse GENERAL:  no fevers, fatigue, appetite changes SKIN: No itching, rash HEENT: No complaint RESPIRATORY: No cough,no  wheezing, no SOB; O2 sat reported 89% yesterday CARDIAC: No chest pain, palpitations, lower extremity edema  GI: No abdominal pain, No N/V/D or constipation, No heartburn or reflux  GU: No dysuria, frequency or urgency, or incontinence  MUSCULOSKELETAL: No unrelieved bone/joint pain NEUROLOGIC: No headache, dizziness  PSYCHIATRIC: No overt anxiety or sadness  Filed Vitals:   08/20/14 1344  BP: 128/75  Pulse: 70  Temp: 96.1 F (35.6 C)  Resp: 18    Physical Exam  GENERAL APPEARANCE: Alert, conversant, No acute distress  SKIN:  No diaphoresis rash HEENT: Unremarkable RESPIRATORY: Breathing is even, unlabored. Lung sounds are decreased at basis, poor effort , no rales  CARDIOVASCULAR: Heart RRR no murmurs, rubs or gallops. No peripheral edema - legs wrapped B GASTROINTESTINAL: Abdomen is soft, non-tender, not distended w/ normal bowel sounds.  GENITOURINARY: Bladder non tender, not distended  MUSCULOSKELETAL: No abnormal joints or musculature NEUROLOGIC: Cranial nerves 2-12 grossly intact. Moves all extremities PSYCHIATRIC: dementia, no behavioral issues  Patient Active Problem List   Diagnosis Date Noted  . Edema 08/19/2014  . Pain, generalized 08/19/2014  . Oxygen desaturation 08/19/2014  . Leukopenia 08/09/2014  . Normocytic anemia 08/09/2014  . Cellulitis 08/06/2014  . Cellulitis of both  lower extremities 08/06/2014  . Prerenal acute renal failure 08/06/2014  . Rib fracture 08/06/2014  . Rib fractures   . Ventral hernia 07/28/2010  . S/P colostomy 07/28/2010  . Shingles 07/28/2010   08/20/2014  WBC 3.0 , 30 segs,   30.7L  32.5 mono  H/H 10.4/31.8  PLT  273 CBC    Component Value Date/Time   WBC 2.0* 08/07/2014 0357   RBC 3.26* 08/07/2014 0357   HGB 10.3* 08/07/2014 0357   HCT 31.6* 08/07/2014 0357   PLT 181 08/07/2014 0357   MCV 96.9 08/07/2014 0357   LYMPHSABS 0.6* 08/06/2014 1415   MONOABS 0.9 08/06/2014 1415   EOSABS 0.0 08/06/2014 1415   BASOSABS 0.0 08/06/2014 1415    CMP     Component Value Date/Time   NA 138 08/06/2014 1415   K 4.9 08/06/2014 1415   CL 107 08/06/2014 1415   CO2 22 08/06/2014 1415   GLUCOSE 124* 08/06/2014 1415   BUN 28* 08/06/2014 1415   CREATININE 0.85 08/06/2014 1415   CALCIUM 8.0* 08/06/2014 1415   PROT 5.7* 08/06/2014 1415   ALBUMIN 2.5* 08/06/2014 1415   AST 45* 08/06/2014 1415   ALT 24 08/06/2014 1415   ALKPHOS 75 08/06/2014 1415   BILITOT 1.0 08/06/2014 1415   GFRNONAA >60 08/06/2014 1415   GFRAA >60 08/06/2014 1415    Assessment and Plan  Oxygen desaturation This is the only really useful info I have. CXR not helpful, exam not particularly helpful. She has no hx CHF, HTN, heart disease and since she has had abd surgery I know she had a pre-op at one time, but no hx or meds.  Statistically pt has risk for PNA 2/2 rib fx. Not presenting like PE.  Later entry  WBC 3.0 which is normal for pt; diff 30 seg, 30 L, 30 M which is odd. Will use levaquin 500 mg daily for 7 days, CrCl calc 51, covers atypicals.    Margit Hanks, MD

## 2014-09-15 ENCOUNTER — Non-Acute Institutional Stay (SKILLED_NURSING_FACILITY): Payer: Medicare Other | Admitting: Internal Medicine

## 2014-09-15 ENCOUNTER — Encounter: Payer: Self-pay | Admitting: Internal Medicine

## 2014-09-15 DIAGNOSIS — R059 Cough, unspecified: Secondary | ICD-10-CM

## 2014-09-15 DIAGNOSIS — S2231XA Fracture of one rib, right side, initial encounter for closed fracture: Secondary | ICD-10-CM

## 2014-09-15 DIAGNOSIS — R05 Cough: Secondary | ICD-10-CM

## 2014-09-15 NOTE — Progress Notes (Signed)
MRN: [id number] Name: [name]  Sex: female Age: 81 y.o. DOB: 13-Oct-1928  [facility] #: [facility] Facility/Room:114 Level Of Care: SNF Provider: Merrilee Seashore D Emergency Contacts: Extended Emergency Contact Information Primary Emergency Contact: Smith,Peggy Address: [address]          [place] [address] [place] Home Phone: [phone] Relation: Friend Secondary Emergency Contact: Layne Benton [place] Home Phone: [phone] Relation: Friend  Code Status: FULL  Allergies: Erythromycin and Penicillins  Chief Complaint  Patient presents with  . Acute Visit    HPI: Patient is 81 y.o. female with colostomy and rib fractures who is c/o today of her cough. She says she has been coughing since she has been here. Both pt and nurse report it is not changes, sputum is white as usual, no increase in O2 needs, no fever.. Nurse thought she heard something in RUL. Pt was given robitussin and says cough is better. Of note pt can not take narcotics, she gets confused, and has been using tylenol only for pain.  Past Medical History  Diagnosis Date  . S/P colostomy 2008  . Shingles     Past Surgical History  Procedure Laterality Date  . Colostomy        Medication List       This list is accurate as of: 09/15/14 11:45 AM.  Always use your most recent med list.               co-enzyme Q-10 30 MG capsule  Take 30 mg by mouth daily.     lidocaine 5 %  Commonly known as:  LIDODERM  Place 1 patch onto the skin daily. Remove & Discard patch within 12 hours or as directed by MD     OIL OF OREGANO PO  Take 10 mLs by mouth daily.     oxycodone 5 MG capsule  Commonly known as:  OXY-IR  Take one tablet by mouth every 4 hours as needed for pain     PROBIOTIC FORMULA PO  Take 1 capsule by mouth daily.     vitamin B-12 1000 MCG tablet  Commonly known as:  CYANOCOBALAMIN  Take 1,000 mcg by mouth daily.     vitamin C 500  MG tablet  Commonly known as:  ASCORBIC ACID  Take 1,000 mg by mouth daily.        No orders of the defined types were placed in this encounter.     There is no immunization history on file for this patient.  Social History  Substance Use Topics  . Smoking status: Never Smoker   . Smokeless tobacco: Not on file  . Alcohol Use: No    Review of Systems  DATA OBTAINED: from patient, nurse- as per HPI GENERAL:  no fevers, fatigue, appetite changes SKIN: No itching, rash HEENT: No complaint RESPIRATORY: + cough,no wheezing,no  SOB CARDIAC: No chest pain, palpitations, lower extremity edema  GI: No abdominal pain, No N/V/D or constipation, No heartburn or reflux  GU: No dysuria, frequency or urgency, or incontinence  MUSCULOSKELETAL: chest wall pain , improvng, still splinting with cough NEUROLOGIC: No headache, dizziness  PSYCHIATRIC: No overt anxiety or sadness  Filed Vitals:   09/15/14 1141  BP: 119/57  Pulse: 70  Temp: 96.2 F (35.7 C)  Resp: 18    Physical Exam  GENERAL APPEARANCE: Alert, conversant, No acute distress  SKIN: No diaphoresis rash, or wounds HEENT: Unremarkable RESPIRATORY: Breathing is  even, unlabored. Lung sounds are slt coarse, rales in RUL cleared with cough, good airflow   CARDIOVASCULAR: Heart RRR no murmurs, rubs or gallops. No peripheral edema  GASTROINTESTINAL: Abdomen is soft, non-tender, not distended w/ normal bowel sounds.  GENITOURINARY: Bladder non tender, not distended  MUSCULOSKELETAL: No abnormal joints or musculature NEUROLOGIC: Cranial nerves 2-12 grossly intact. Moves all extremities PSYCHIATRIC: Mood and affect appropriate to situation, no behavioral issues  Patient Active Problem List   Diagnosis Date Noted  . Edema 08/19/2014  . Pain, generalized 08/19/2014  . Oxygen desaturation 08/19/2014  . Leukopenia 08/09/2014  . Normocytic anemia 08/09/2014  . Cellulitis 08/06/2014  . Cellulitis of both lower extremities  08/06/2014  . Prerenal acute renal failure 08/06/2014  . Rib fracture 08/06/2014  . Rib fractures   . Ventral hernia 07/28/2010  . S/P colostomy 07/28/2010  . Shingles 07/28/2010    CBC    Component Value Date/Time   WBC 2.0* 08/07/2014 0357   RBC 3.26* 08/07/2014 0357   HGB 10.3* 08/07/2014 0357   HCT 31.6* 08/07/2014 0357   PLT 181 08/07/2014 0357   MCV 96.9 08/07/2014 0357   LYMPHSABS 0.6* 08/06/2014 1415   MONOABS 0.9 08/06/2014 1415   EOSABS 0.0 08/06/2014 1415   BASOSABS 0.0 08/06/2014 1415    CMP     Component Value Date/Time   NA 138 08/06/2014 1415   K 4.9 08/06/2014 1415   CL 107 08/06/2014 1415   CO2 22 08/06/2014 1415   GLUCOSE 124* 08/06/2014 1415   BUN 28* 08/06/2014 1415   CREATININE 0.85 08/06/2014 1415   CALCIUM 8.0* 08/06/2014 1415   PROT 5.7* 08/06/2014 1415   ALBUMIN 2.5* 08/06/2014 1415   AST 45* 08/06/2014 1415   ALT 24 08/06/2014 1415   ALKPHOS 75 08/06/2014 1415   BILITOT 1.0 08/06/2014 1415   GFRNONAA >60 08/06/2014 1415   GFRAA >60 08/06/2014 1415    Assessment and Plan  COUGH in setting of rib fractures - pt has done really well, given limitations for narcotic use in a painful condition, and has been closely watched for possible PNA's; sputum is same color and amt as prior, coughing not increased, cont to monitor;will see if tessalon perles help  Time spent 25 min; > 50% of time with patient was spent reviewing records, labs, tests and studies, counseling and developing plan of care  Margit Hanks, MD

## 2014-09-27 ENCOUNTER — Encounter (HOSPITAL_COMMUNITY): Payer: Self-pay | Admitting: Emergency Medicine

## 2014-09-27 ENCOUNTER — Encounter: Payer: Self-pay | Admitting: Internal Medicine

## 2014-09-27 ENCOUNTER — Inpatient Hospital Stay (HOSPITAL_COMMUNITY)
Admission: EM | Admit: 2014-09-27 | Discharge: 2014-10-01 | DRG: 309 | Disposition: A | Discharge status: Skilled Nursing Facility | Payer: Medicare Other | Attending: Cardiology | Admitting: Cardiology

## 2014-09-27 ENCOUNTER — Emergency Department (HOSPITAL_COMMUNITY): Payer: Medicare Other

## 2014-09-27 ENCOUNTER — Non-Acute Institutional Stay (SKILLED_NURSING_FACILITY): Payer: Medicare Other | Admitting: Internal Medicine

## 2014-09-27 DIAGNOSIS — J90 Pleural effusion, not elsewhere classified: Secondary | ICD-10-CM | POA: Diagnosis present

## 2014-09-27 DIAGNOSIS — R609 Edema, unspecified: Secondary | ICD-10-CM

## 2014-09-27 DIAGNOSIS — I214 Non-ST elevation (NSTEMI) myocardial infarction: Secondary | ICD-10-CM | POA: Diagnosis not present

## 2014-09-27 DIAGNOSIS — D72819 Decreased white blood cell count, unspecified: Secondary | ICD-10-CM | POA: Diagnosis not present

## 2014-09-27 DIAGNOSIS — Z833 Family history of diabetes mellitus: Secondary | ICD-10-CM | POA: Diagnosis not present

## 2014-09-27 DIAGNOSIS — F600 Paranoid personality disorder: Secondary | ICD-10-CM | POA: Diagnosis present

## 2014-09-27 DIAGNOSIS — W19XXXD Unspecified fall, subsequent encounter: Secondary | ICD-10-CM | POA: Diagnosis present

## 2014-09-27 DIAGNOSIS — Z809 Family history of malignant neoplasm, unspecified: Secondary | ICD-10-CM

## 2014-09-27 DIAGNOSIS — I479 Paroxysmal tachycardia, unspecified: Secondary | ICD-10-CM | POA: Diagnosis not present

## 2014-09-27 DIAGNOSIS — I480 Paroxysmal atrial fibrillation: Secondary | ICD-10-CM | POA: Diagnosis present

## 2014-09-27 DIAGNOSIS — Z87898 Personal history of other specified conditions: Secondary | ICD-10-CM

## 2014-09-27 DIAGNOSIS — I471 Supraventricular tachycardia: Secondary | ICD-10-CM | POA: Diagnosis not present

## 2014-09-27 DIAGNOSIS — S2241XD Multiple fractures of ribs, right side, subsequent encounter for fracture with routine healing: Secondary | ICD-10-CM

## 2014-09-27 DIAGNOSIS — R011 Cardiac murmur, unspecified: Secondary | ICD-10-CM | POA: Diagnosis present

## 2014-09-27 DIAGNOSIS — I248 Other forms of acute ischemic heart disease: Secondary | ICD-10-CM | POA: Diagnosis present

## 2014-09-27 DIAGNOSIS — I4891 Unspecified atrial fibrillation: Secondary | ICD-10-CM

## 2014-09-27 DIAGNOSIS — R7989 Other specified abnormal findings of blood chemistry: Secondary | ICD-10-CM | POA: Diagnosis not present

## 2014-09-27 DIAGNOSIS — D649 Anemia, unspecified: Secondary | ICD-10-CM | POA: Diagnosis present

## 2014-09-27 DIAGNOSIS — R0989 Other specified symptoms and signs involving the circulatory and respiratory systems: Secondary | ICD-10-CM

## 2014-09-27 DIAGNOSIS — R079 Chest pain, unspecified: Secondary | ICD-10-CM | POA: Diagnosis present

## 2014-09-27 DIAGNOSIS — R739 Hyperglycemia, unspecified: Secondary | ICD-10-CM | POA: Diagnosis present

## 2014-09-27 DIAGNOSIS — Z933 Colostomy status: Secondary | ICD-10-CM

## 2014-09-27 DIAGNOSIS — Z8249 Family history of ischemic heart disease and other diseases of the circulatory system: Secondary | ICD-10-CM | POA: Diagnosis not present

## 2014-09-27 DIAGNOSIS — R778 Other specified abnormalities of plasma proteins: Secondary | ICD-10-CM

## 2014-09-27 HISTORY — DX: Unspecified fall, initial encounter: W19.XXXA

## 2014-09-27 HISTORY — DX: Anemia, unspecified: D64.9

## 2014-09-27 HISTORY — DX: Other specified abnormal findings of blood chemistry: R79.89

## 2014-09-27 HISTORY — DX: Ventral hernia without obstruction or gangrene: K43.9

## 2014-09-27 HISTORY — DX: Cellulitis, unspecified: L03.90

## 2014-09-27 LAB — CBC WITH DIFFERENTIAL/PLATELET
Basophils Absolute: 0 10*3/uL (ref 0.0–0.1)
Basophils Relative: 1 %
EOS ABS: 0.2 10*3/uL (ref 0.0–0.7)
EOS PCT: 5 %
HCT: 37.6 % (ref 36.0–46.0)
Hemoglobin: 11.9 g/dL — ABNORMAL LOW (ref 12.0–15.0)
LYMPHS ABS: 0.9 10*3/uL (ref 0.7–4.0)
Lymphocytes Relative: 27 %
MCH: 30.8 pg (ref 26.0–34.0)
MCHC: 31.6 g/dL (ref 30.0–36.0)
MCV: 97.4 fL (ref 78.0–100.0)
MONOS PCT: 22 %
Monocytes Absolute: 0.8 10*3/uL (ref 0.1–1.0)
Neutro Abs: 1.6 10*3/uL — ABNORMAL LOW (ref 1.7–7.7)
Neutrophils Relative %: 45 %
PLATELETS: 233 10*3/uL (ref 150–400)
RBC: 3.86 MIL/uL — ABNORMAL LOW (ref 3.87–5.11)
RDW: 15.4 % (ref 11.5–15.5)
WBC: 3.4 10*3/uL — ABNORMAL LOW (ref 4.0–10.5)

## 2014-09-27 LAB — BASIC METABOLIC PANEL
ANION GAP: 11 (ref 5–15)
BUN: 26 mg/dL — AB (ref 6–20)
CO2: 23 mmol/L (ref 22–32)
Calcium: 8.8 mg/dL — ABNORMAL LOW (ref 8.9–10.3)
Chloride: 105 mmol/L (ref 101–111)
Creatinine, Ser: 0.7 mg/dL (ref 0.44–1.00)
GFR calc Af Amer: >60 mL/min (ref >60)
GLUCOSE: 132 mg/dL — AB (ref 65–99)
POTASSIUM: 4.4 mmol/L (ref 3.5–5.1)
Sodium: 139 mmol/L (ref 135–145)

## 2014-09-27 LAB — I-STAT TROPONIN, ED: Troponin i, poc: 0.23 ng/mL (ref 0.00–0.08)

## 2014-09-27 LAB — BRAIN NATRIURETIC PEPTIDE: B NATRIURETIC PEPTIDE 5: 100.5 pg/mL — AB (ref 0.0–100.0)

## 2014-09-27 LAB — TSH: TSH: 6.374 u[IU]/mL — ABNORMAL HIGH (ref 0.350–4.500)

## 2014-09-27 LAB — TROPONIN I: Troponin I: 0.31 ng/mL — ABNORMAL HIGH (ref <0.031)

## 2014-09-27 MED ORDER — VITAMIN C 500 MG PO TABS
1000.0000 mg | ORAL_TABLET | Freq: Every day | ORAL | Status: DC
  Administered 2014-09-28 – 2014-10-01 (×4): 1000 mg via ORAL
  Filled 2014-09-27 (×4): qty 2

## 2014-09-27 MED ORDER — BENZONATATE 100 MG PO CAPS
100.0000 mg | ORAL_CAPSULE | Freq: Three times a day (TID) | ORAL | Status: DC
  Administered 2014-09-28 (×3): 100 mg via ORAL
  Filled 2014-09-27 (×3): qty 1

## 2014-09-27 MED ORDER — HEPARIN BOLUS VIA INFUSION
2500.0000 [IU] | Freq: Once | INTRAVENOUS | Status: AC
  Administered 2014-09-27: 2500 [IU] via INTRAVENOUS
  Filled 2014-09-27: qty 2500

## 2014-09-27 MED ORDER — ONDANSETRON HCL 4 MG/2ML IJ SOLN: 4.0000 mg | Freq: Four times a day (QID) | INTRAMUSCULAR | Status: DC | PRN

## 2014-09-27 MED ORDER — HEPARIN (PORCINE) IN NACL 100-0.45 UNIT/ML-% IJ SOLN
600.0000 [IU]/h | INTRAMUSCULAR | Status: DC
  Administered 2014-09-27: 600 [IU]/h via INTRAVENOUS
  Filled 2014-09-27: qty 250

## 2014-09-27 MED ORDER — SACCHAROMYCES BOULARDII 250 MG PO CAPS
250.0000 mg | ORAL_CAPSULE | Freq: Every day | ORAL | Status: DC
Start: 2014-09-28 — End: 2014-10-01
  Administered 2014-09-28 – 2014-10-01 (×4): 250 mg via ORAL
  Filled 2014-09-27 (×5): qty 1

## 2014-09-27 MED ORDER — NITROGLYCERIN 0.4 MG SL SUBL: 0.4000 mg | SUBLINGUAL_TABLET | SUBLINGUAL | Status: DC | PRN

## 2014-09-27 MED ORDER — RISPERIDONE 0.25 MG PO TABS
0.2500 mg | ORAL_TABLET | Freq: Two times a day (BID) | ORAL | Status: DC
  Administered 2014-09-28 – 2014-10-01 (×7): 0.25 mg via ORAL
  Filled 2014-09-27 (×9): qty 1

## 2014-09-27 MED ORDER — SODIUM CHLORIDE 0.9 % IJ SOLN
3.0000 mL | INTRAMUSCULAR | Status: DC | PRN
Start: 2014-09-27 — End: 2014-10-01

## 2014-09-27 MED ORDER — ASPIRIN EC 81 MG PO TBEC
81.0000 mg | DELAYED_RELEASE_TABLET | Freq: Every day | ORAL | Status: DC
  Administered 2014-09-28 – 2014-09-30 (×3): 81 mg via ORAL
  Filled 2014-09-27 (×3): qty 1

## 2014-09-27 MED ORDER — CARVEDILOL 3.125 MG PO TABS
3.1250 mg | ORAL_TABLET | Freq: Two times a day (BID) | ORAL | Status: DC
  Administered 2014-09-28: 3.125 mg via ORAL
  Filled 2014-09-27 (×2): qty 1

## 2014-09-27 MED ORDER — SODIUM CHLORIDE 0.9 % IV SOLN
250.0000 mL | INTRAVENOUS | Status: DC | PRN
Start: 2014-09-27 — End: 2014-10-01

## 2014-09-27 MED ORDER — VITAMIN B-12 1000 MCG PO TABS
1000.0000 ug | ORAL_TABLET | Freq: Every day | ORAL | Status: DC
  Administered 2014-09-28 – 2014-10-01 (×4): 1000 ug via ORAL
  Filled 2014-09-27 (×4): qty 1

## 2014-09-27 MED ORDER — COENZYME Q10 30 MG PO CAPS: 30.0000 mg | ORAL_CAPSULE | Freq: Every day | ORAL | Status: DC

## 2014-09-27 MED ORDER — SODIUM CHLORIDE 0.9 % IJ SOLN
3.0000 mL | Freq: Two times a day (BID) | INTRAMUSCULAR | Status: DC
  Administered 2014-09-28 – 2014-10-01 (×6): 3 mL via INTRAVENOUS

## 2014-09-27 NOTE — Progress Notes (Signed)
ANTICOAGULATION CONSULT NOTE - Initial Consult  Pharmacy Consult for heparin Indication: chest pain/ACS and atrial fibrillation  Allergies  Allergen Reactions  . Erythromycin Nausea And Vomiting  . Penicillins Other (See Comments)    Per MAR -   . Vicodin [Hydrocodone-Acetaminophen]     Patient reports history of significant hallucinations and agitation when taking narcotics in the past     Patient Measurements: Height:  (132.1 cm) Weight: 157 lb (71.215 kg) IBW/kg (Calculated) : 27.1 Heparin Dosing Weight: 45.1kg  Vital Signs: Temp: 98.7 F (37.1 C) (09/26 1646) Temp Source: Oral (09/26 1646) BP: 161/83 mmHg (09/26 2000) Pulse Rate: 73 (09/26 2000)  Labs:  Recent Labs  09/27/14 1735 09/27/14 1815  HGB  --  11.9*  HCT  --  37.6  PLT  --  233  CREATININE 0.70  --   TROPONINI  --  0.31*    Estimated Creatinine Clearance: 37.6 mL/min (by C-G formula based on Cr of 0.7).   Medical History: Past Medical History  Diagnosis Date  . S/P colostomy 2008  . Shingles   . Ventral hernia     a. s/p colostomy bag.  . Normocytic anemia   . Fall     a. 08/2014 c/b rib fractures.  . Cellulitis 08/2014  . Prerenal azotemia 08/2014    Medications:  Infusions:  . heparin      Assessment: 64 yof presented to the ED with CP and found to be in afib. To start IV heparin for anticoagulation. Baseline H/H slightly low and platelets are WNL. No anticoagulation PTA. Troponin is elevated.   Goal of Therapy:  Heparin level 0.3-0.7 units/ml Monitor platelets by anticoagulation protocol: Yes   Plan:  - Heparin bolus 2500 units IV x 1 - Heparin gtt 600 units/hr - Check an 8 hour heparin level - Daily heparin level and CBC - F/u plans for oral anticoag  Rumbarger, Drake Leach 09/27/2014,9:21 PM

## 2014-09-27 NOTE — ED Notes (Signed)
Pt is now in NSR when placed on monitor in B17

## 2014-09-27 NOTE — H&P (Signed)
Cardiology Consultation Note  Patient ID: [name], MRN: [id number], DOB/AGE: [birth year] 79 y.o. Admit date: 09/27/2014   Date of Consult: 09/27/2014 Primary Physician: Paulino Rily, MD Primary Cardiologist: Dr. Delton See   Chief Complaint: chest pain Reason for Consultation: chest pain, transient atrial fibrillation  HPI: [name] is a lively 81 y/o with history of ventral hernia s/p chronic colostomy bag (since 2008), normocytic anemia (on B12), and recent admission for mechanical fall, right-sided rib fractures, BLE cellulitis (LE duplex neg for DVT), pre-renal AKI who presented to [facility] today with chest pain, briefly recorded atrial fibrillation and elevated troponin. She was admitted in August for the above and went to SNF for active wound care and deconditioning. She denies history of frequent falls and reports that August's fall was due to losing her balance.  She has been living at [facility] and steadily working with rehab. She says this has been going well and is surprised she had any problems today because she has not had any recent exertional CP or SOB working with PT and going up and down stairs. This morning they woke her around 4am for a bath. Shortly after that time she began to notice left sided chest pain with associated diaphoresis and SOB. This lasted one hour. She reports she called out for help. EMS was reportedly called and it was determined that she did not need to go to the hospital. She felt in the afternoon while working with rehab that she had much less stamina than usual - felt fatigued after walking. She was reportedly seen by the nursing facility doctor around 4pm who noticed her HR was irregular. EMS was called again who found her in atrial fib. Rate 129bpm by tracing, reported HR 90-160bpm per EMS. Patient reports she was given 2 full size aspirin but not clear what other intervention was done. Here she is in NSR with HR in the  70s-90s. Labs notable for trop 0.23->0.31, WBC 3.4, Hgb 11.9 (stable from prior), BNP 100.5, BUN 26/Cr 0.7, glucose 132, TSH 6.374. CXR showed small-mod right pleural effusion and probable small left pleural effusion. In the ER, pulse ox 99% RA, BP 118->160 systolic.   She is currently asymptomatic - except for being upset that she will miss the party - by which she means the party debates tonight. No recent nausea, vomiting, abd pain, bleeding, syncope, or awareness of palpitations.  Past Medical History  Diagnosis Date  . S/P colostomy 2008  . Shingles   . Ventral hernia     a. s/p colostomy bag.  . Normocytic anemia   . Fall     a. 08/2014 c/b rib fractures.  . Cellulitis 08/2014  . Prerenal azotemia 08/2014      Most Recent Cardiac Studies: None   Surgical History:  Past Surgical History  Procedure Laterality Date  . Colostomy    . Total abdominal hysterectomy       Home Meds: Prior to Admission medications   Medication Sig Start Date End Date Taking? Authorizing Provider  acetaminophen (TYLENOL) 500 MG tablet Take 1,000 mg by mouth 3 (three) times daily. Tylenol 500 mg   2 tabs po TID   Yes Historical Provider, MD  benzonatate (TESSALON) 100 MG capsule Take 100 mg by mouth 3 (three) times daily.   Yes Historical Provider, MD  co-enzyme Q-10 30 MG capsule Take 30 mg by mouth daily.     Yes Historical Provider, MD  lidocaine (LIDODERM) 5 %  Place 1 patch onto the skin daily. Remove & Discard patch within 12 hours or as directed by MD 08/09/14  Yes Marinda Elk, MD  risperiDONE (RISPERDAL) 0.25 MG tablet Take 0.25 mg by mouth 2 (two) times daily.   Yes Historical Provider, MD  saccharomyces boulardii (FLORASTOR) 250 MG capsule Take 250 mg by mouth daily.   Yes Historical Provider, MD  vitamin B-12 (CYANOCOBALAMIN) 1000 MCG tablet Take 1,000 mcg by mouth daily.   Yes Historical Provider, MD  vitamin C (ASCORBIC ACID) 500 MG tablet Take 1,000 mg by mouth daily.   Yes Historical  Provider, MD   Inpatient Medications:   Allergies:  Allergies  Allergen Reactions  . Erythromycin Nausea And Vomiting  . Penicillins Other (See Comments)    Per MAR -     Social History   Social History  . Marital Status: Single    Spouse Name: N/A  . Number of Children: N/A  . Years of Education: N/A   Occupational History  . Not on file.   Social History Main Topics  . Smoking status: Never Smoker   . Smokeless tobacco: Not on file  . Alcohol Use: No  . Drug Use: No  . Sexual Activity: Not on file   Other Topics Concern  . Not on file   Social History Narrative     Family History  Problem Relation Age of Onset  . Heart failure Mother     Took Lasix  . Heart disease Father     Patient thinks father had some sort of heart disease but not sure what kind. No CAD in siblings.  . Prostate cancer Brother   . Diabetes Brother      Review of Systems: All other systems reviewed and are otherwise negative except as noted above.  Labs:  Recent Labs  09/27/14 1815  TROPONINI 0.31*   Lab Results  Component Value Date   WBC 3.4* 09/27/2014   HGB 11.9* 09/27/2014   HCT 37.6 09/27/2014   MCV 97.4 09/27/2014   PLT 233 09/27/2014    Recent Labs Lab 09/27/14 1735  NA 139  K 4.4  CL 105  CO2 23  BUN 26*  CREATININE 0.70  CALCIUM 8.8*  GLUCOSE 132*   No results found for: CHOL, HDL, LDLCALC, TRIG No results found for: DDIMER  Radiology/Studies:  Dg Chest Portable 1 View  09/27/2014   CLINICAL DATA:  Atrial fibrillation. Transient chest pain. Chest pain for 1 day. RIGHT-sided rib pain. Fractured RIGHT ribs.  EXAM: PORTABLE CHEST 1 VIEW  COMPARISON:  08/06/2014 chest radiograph.  FINDINGS: Small to moderate RIGHT pleural effusion or hemothorax is present. Associated atelectasis. Superimposed consolidation cannot be excluded. There also appears to be a LEFT pleural effusion with atelectasis in the retrocardiac region. Monitoring leads project over the chest.  Some interval healing of RIGHT-sided rib fractures. Cardiopericardial silhouette is probably within normal limits allowing for projection.  IMPRESSION: 1. Small to moderate RIGHT pleural effusion. Probable small LEFT pleural effusion. 2. RIGHT-greater-than-LEFT basilar atelectasis. Superimposed consolidation cannot be excluded. 3. Healing RIGHT-sided rib fractures.   Electronically Signed   By: Andreas Newport M.D.   On: 09/27/2014 17:00    Wt Readings from Last 3 Encounters:  08/06/14 162 lb 4.8 oz (73.619 kg)  07/28/10 157 lb 12.8 oz (71.578 kg)   EKG:  1) coarse atrial fib 129bpm with nonspecific ST-T changes; latter part of EKG appears possibly NSR 2) NSR 93bpm probable LAE, nonspecific T wave flattening  I, avL, otherwise nonacute  Physical Exam:Blood pressure 136/46, pulse 78, temperature 98.7 F (37.1 C), temperature source Oral, resp. rate 25, SpO2 96 %. General: Well developed, well nourished lively and talkative WF in no acute distress. Head: Normocephalic, atraumatic, sclera non-icteric, no xanthomas, nares are without discharge.  Neck: Soft bilateral carotid bruits. JVD not elevated. Lungs: Mildly diminished BS right lung base. Otherwise no wheezes, rales, or rhonchi. Breathing is unlabored. Heart: RRR with S1 S2. 2/6 SEM. No rubs or gallops appreciated. Abdomen: Soft, non-tender, non-distended with normoactive bowel sounds. No hepatomegaly. No rebound/guarding. No obvious abdominal masses. Msk:  Strength and tone appear normal for age. Extremities: No clubbing or cyanosis. Trace soft BLE edema/erythema.  Distal pedal pulses are 2+ and equal bilaterally. Neuro: Alert and oriented X 3. No facial asymmetry. No focal deficit. Moves all extremities spontaneously. Psych:  Responds to questions appropriately with a normal affect.    Assessment and Plan: 64F with h/o chronic colostomy, normocytic anemia, and recent admission for mechanical fall, right-sided rib fractures, BLE cellulitis  (LE duplex neg for DVT), pre-renal AKI who presented to [facility]   1. Chest pain and elevated troponin, possibly representative of NSTEMI versus demand ischemia - hard to know if symptoms are related to transient atrial fibrillation versus underlying CAD. She denies any recent history of symptoms up until this morning and is presently asymptomatic while in NSR in the ER. At this time we plan to admit, cycle troponins, place on IV heparin, check echocardiogram and follow troponin. If troponin continues to increase or 2D echocardiogram is abnormal, she will require cardiac cath before committing to oral anticoagulation. However, if troponin plateaus and EF is normal, it would be appropriate to instead consider inpatient stress testing.   2. Newly recognized transient atrial fibrillation with RVR - CHADSVASC is at least 3 for age and female, but she also has elevated BP and glucose in the ER. See above re: anticoagulation. F/u thyroid studies and check A1C. Follow on telemetry. Start metoprolol. Check echocardiogram.  3. Small-mod pleural effusions - question related to rapid rate versus recent rib fractures. Follow oxygenation and symptoms.  4. Normocytic anemia with decreased WBC - stable Hgb. WBC slightly improved from prior leukopenia but will need to be followed as outpatient by primary care.  5. Hyperglycemia - check A1C.  6. Recent cellulitis - patient reports leg appearance is chronic for her. Follow.  7. Abnormal TSH - check free T4 and T3.   8. Elevated blood pressure - follow with addition of metoprolol.  9. Carotid bruits and systolic murmur - check 2D echo and carotid duplex.  SignedRonie Spies PA-C 09/27/2014, 7:53 PM Pager: [phone]  The patient was seen, examined and discussed with Ronie Spies, PA-C and I agree with the above.   81 year old female with h/o chronic B/L cellulitis, chronic colostomy, transferred here from NH after emotional upset associated with chest  pain. She was in a-fib with RVR in the ambulance, in SR in the ER. Troponin minimally elevated 0.23 --> 0.31.  She denies chest pain or a-fib prior to this episode.  We will start Heparin iv and plan for an echocardiogram. If there are wall motion abnormalities or significant troponin elevation we would plan for a cardiac cath, otherwise stress test in the am. She has a significant systolic murmur - most probably AS, we will wait for echo results. We will have to decide about anticoagulation prior to the discharge, CHAD-VASc is minimum 4, for  now iv Heparin. Add carvedilol 3.125 mg po BID.   Lars Masson 09/27/2014

## 2014-09-27 NOTE — ED Provider Notes (Signed)
CSN: [id number]     Arrival date & time 09/27/14  1631 History   First MD Initiated Contact with Patient 09/27/14 1645     Chief Complaint  Patient presents with  . Irregular Heart Beat   Patient is a 81 y.o. female presenting with chest pain.  Chest Pain Pain location:  L chest Pain quality: aching   Pain radiates to:  Does not radiate Pain radiates to the back: no   Pain severity:  Moderate Onset quality:  Gradual Timing:  Constant Progression:  Resolved Chronicity:  New Associated symptoms: palpitations and shortness of breath   Associated symptoms: no fever    Past Medical History  Diagnosis Date  . S/P colostomy 2008  . Shingles    Past Surgical History  Procedure Laterality Date  . Colostomy     Family History  Problem Relation Age of Onset  . CAD Mother    Social History  Substance Use Topics  . Smoking status: Never Smoker   . Smokeless tobacco: None  . Alcohol Use: No   OB History    No data available     Review of Systems  Constitutional: Negative for fever.  Respiratory: Positive for shortness of breath.   Cardiovascular: Positive for chest pain and palpitations.  All other systems reviewed and are negative.  Allergies  Erythromycin and Penicillins  Home Medications   Prior to Admission medications   Medication Sig Start Date End Date Taking? Authorizing Provider  acetaminophen (TYLENOL) 500 MG tablet Take 500 mg by mouth every 6 (six) hours as needed. Tylenol 500 mg   2 tabs po TID    Historical Provider, MD  benzonatate (TESSALON) 100 MG capsule Take 100 mg by mouth 3 (three) times daily.    Historical Provider, MD  co-enzyme Q-10 30 MG capsule Take 30 mg by mouth daily.      Historical Provider, MD  lidocaine (LIDODERM) 5 % Place 1 patch onto the skin daily. Remove & Discard patch within 12 hours or as directed by MD 08/09/14   Marinda Elk, MD  OIL OF OREGANO PO Take 10 mLs by mouth daily.    Historical Provider, MD  Probiotic Product  (PROBIOTIC FORMULA PO) Take 1 capsule by mouth daily.      Historical Provider, MD  vitamin B-12 (CYANOCOBALAMIN) 1000 MCG tablet Take 1,000 mcg by mouth daily.    Historical Provider, MD  vitamin C (ASCORBIC ACID) 500 MG tablet Take 1,000 mg by mouth daily.    Historical Provider, MD   There were no vitals taken for this visit. Physical Exam  Constitutional: She is oriented to person, place, and time. She appears well-developed and well-nourished. No distress.  HENT:  Head: Normocephalic.  Eyes: Pupils are equal, round, and reactive to light.  Neck: Normal range of motion.  Cardiovascular: Normal rate and regular rhythm.   Pulmonary/Chest: Effort normal. No respiratory distress. She has no wheezes.  Mild crackles in bilateral bases  Abdominal: Soft. She exhibits no distension. There is no tenderness. There is no rebound.  Neurological: She is oriented to person, place, and time. No cranial nerve deficit.  Skin: Skin is warm and dry. She is not diaphoretic.  Healing cellulitis of lower extremities with +1 edema.   Psychiatric: She has a normal mood and affect. Her behavior is normal. Thought content normal.  Nursing note and vitals reviewed.   ED Course  Procedures (including critical care time) Labs Review Labs Reviewed  BASIC METABOLIC PANEL -  Abnormal; Notable for the following:    Glucose, Bld 132 (*)    BUN 26 (*)    Calcium 8.8 (*)    All other components within normal limits  TSH - Abnormal; Notable for the following:    TSH 6.374 (*)    All other components within normal limits  TROPONIN I - Abnormal; Notable for the following:    Troponin I 0.31 (*)    All other components within normal limits  CBC WITH DIFFERENTIAL/PLATELET - Abnormal; Notable for the following:    WBC 3.4 (*)    RBC 3.86 (*)    Hemoglobin 11.9 (*)    Neutro Abs 1.6 (*)    All other components within normal limits  BRAIN NATRIURETIC PEPTIDE - Abnormal; Notable for the following:    B Natriuretic  Peptide 100.5 (*)    All other components within normal limits  TROPONIN I - Abnormal; Notable for the following:    Troponin I 0.23 (*)    All other components within normal limits  I-STAT TROPOININ, ED - Abnormal; Notable for the following:    Troponin i, poc 0.23 (*)    All other components within normal limits  MRSA PCR SCREENING  CBC WITH DIFFERENTIAL/PLATELET  HEPARIN LEVEL (UNFRACTIONATED)  CBC  TROPONIN I  TROPONIN I  TROPONIN I  HEMOGLOBIN A1C  T4, FREE  MAGNESIUM  COMPREHENSIVE METABOLIC PANEL  LIPID PANEL  PROTIME-INR    Imaging Review Dg Chest Portable 1 View  09/27/2014   CLINICAL DATA:  Atrial fibrillation. Transient chest pain. Chest pain for 1 day. RIGHT-sided rib pain. Fractured RIGHT ribs.  EXAM: PORTABLE CHEST 1 VIEW  COMPARISON:  08/06/2014 chest radiograph.  FINDINGS: Small to moderate RIGHT pleural effusion or hemothorax is present. Associated atelectasis. Superimposed consolidation cannot be excluded. There also appears to be a LEFT pleural effusion with atelectasis in the retrocardiac region. Monitoring leads project over the chest. Some interval healing of RIGHT-sided rib fractures. Cardiopericardial silhouette is probably within normal limits allowing for projection.  IMPRESSION: 1. Small to moderate RIGHT pleural effusion. Probable small LEFT pleural effusion. 2. RIGHT-greater-than-LEFT basilar atelectasis. Superimposed consolidation cannot be excluded. 3. Healing RIGHT-sided rib fractures.   Electronically Signed   By: Andreas Newport M.D.   On: 09/27/2014 17:00   I have personally reviewed and evaluated these images and lab results as part of my medical decision-making.   EKG Interpretation   Date/Time:  Monday September 27 2014 16:41:02 EDT Ventricular Rate:  93 PR Interval:  132 QRS Duration: 87 QT Interval:  330 QTC Calculation: 410 R Axis:   39 Text Interpretation:  Sinus rhythm Probable left atrial enlargement  Nonspecific T abnormalities,  lateral leads No significant change since  last tracing Confirmed by LIU MD, Annabelle Harman ([id number]) on 09/27/2014 5:07:07 PM      MDM   Ms. Swartzendruber is a 81 y.o female who presents to the ED with new onset of atrial fibrillation/atrial flutter with block from her rehab facility, [facility]. She is pleasant, alert and oriented X 4 and no history of ACS but had chest pain that lasted 1 hour this morning with SOB and diaphoresis. EMS was called and a decision was made that patient would not come to the ED for evaluation (patient reportedly refused). Facility doctor saw patient today and found that she had irregular tachycardic HR. Patient denies chest pain or SOB now. No history of blood clots.   Patient given ASA by EMS. Patient well appearing at this  time however story is concerning. Patient has slight elevation in troponin. Patient admitted for further management. Pleural effusion found. Patient admitted for further management.   Final diagnoses:  NSTEMI (non-ST elevated myocardial infarction)  Paroxysmal atrial fibrillation  Pleural effusion  Edema       Deirdre Peer, MD 09/28/14 0104  Lavera Guise, MD 09/28/14 1124  Lavera Guise, MD 09/28/14 1128

## 2014-09-27 NOTE — Progress Notes (Signed)
PHARMACIST - PHYSICIAN ORDER COMMUNICATION  CONCERNING: P&T Medication Policy on Herbal Medications  DESCRIPTION:  This patient's order for:  Coenzyme Q 10  has been noted.  This product(s) is classified as an "herbal" or natural product. Due to a lack of definitive safety studies or FDA approval, nonstandard manufacturing practices, plus the potential risk of unknown drug-drug interactions while on inpatient medications, the Pharmacy and Therapeutics Committee does not permit the use of "herbal" or natural products of this type within [facility].   ACTION TAKEN: The pharmacy department is unable to verify this order at this time and your patient has been informed of this safety policy. Please reevaluate patient's clinical condition at discharge and address if the herbal or natural product(s) should be resumed at that time.  Christoper Fabian, PharmD, BCPS Clinical pharmacist, pager [phone] 09/27/2014 11:34 PM

## 2014-09-27 NOTE — ED Notes (Signed)
Pt to ED via [facility] from [facility]  with complaint of chest pain that started at 4 am, woke pt up from sleep, facility called EMS - it was determined that she didn't need to be taken to the hospital. At 4 pm the doctor at the facility noticed that pt heart rate was irregular, she has no hx of atrial fibrillation. Per EMS rate is between 90-160. Other VS are stable. A/O x4.

## 2014-09-27 NOTE — Progress Notes (Signed)
Patient ID: [name], female   DOB: 1928-09-04, 81 y.o.   MRN: [id number]  MRN: [id number] Name: [name]  Sex: female Age: 81 y.o. DOB: [birth year]  [facility] #: [name] Facility/Room:114 Level Of Care: SNF Provider: Roena Norris Emergency Contacts: Extended Emergency Contact Information Primary Emergency Contact: Smith,Peggy Address: [address]          [place] [address] [place] Home Phone: [phone] Relation: Friend Secondary Emergency Contact: Layne Benton [place] Home Phone: [phone] Relation: Friend  Code Status: FULL  Allergies: Erythromycin; Penicillins; and Vicodin  Chief Complaint  Patient presents with  . Acute Visit   acute visit secondary to tachycardia arrhythmia-apparently chest pain overnight  HPI: Patient is 81 y.o. female with large ventral hernia status post colostomy bag placement She is here for rehabilitation after sustaining a fall with right-sided rib fractures as well as significant weakness-she was also treated for lower extremity cellulitis.  Her stay here has been relatively uncomplicated she has improved.  Apparently overnight she developed some chest pain is thought possibly to be position related was after she was given a bath-EMS was called but patient apparently decided not to go the hospital the chest pain resolved-- Per nursing she has been baseline with no complaints today-.  However when I evaluated the patient her pulse was up into the 140s area-on serial evaluations over the next few minutes her pulse rate varied from 100 up to 140 with some irregularity at times and other times sounded regular.  She did not complain of any chest pain or increased shortness of breath-  Apparently EKG was done when EMS was here overnight and was not overtly concerning.  She does not have a listed history of significant atrial fibrillation or irregularities.  Her blood pressure  is 160/80 other than her pulse rate appears to be stable O2 saturation is in the 90s on oxygen which she is on fairly chronically     .  Past Medical History  Diagnosis Date  . S/P colostomy 2008  . Shingles   . Ventral hernia     a. s/p colostomy bag.  . Normocytic anemia   . Fall     a. 08/2014 c/b rib fractures.  . Cellulitis 08/2014  . Prerenal azotemia 08/2014    Past Surgical History  Procedure Laterality Date  . Colostomy    . Total abdominal hysterectomy        Medication List       This list is accurate as of: 09/27/14  4:31 PM.  Always use your most recent med list.               acetaminophen 500 MG tablet  Commonly known as:  TYLENOL  Take 1,000 mg by mouth 3 (three) times daily. Tylenol 500 mg   2 tabs po TID     benzonatate 100 MG capsule  Commonly known as:  TESSALON  Take 100 mg by mouth 3 (three) times daily.     co-enzyme Q-10 30 MG capsule  Take 30 mg by mouth daily.     lidocaine 5 %  Commonly known as:  LIDODERM  Place 1 patch onto the skin daily. Remove & Discard patch within 12 hours or as directed by MD     OIL OF OREGANO PO  Take 10 mLs by mouth daily.     PROBIOTIC FORMULA PO  Take 1 capsule by mouth daily.  risperiDONE 0.25 MG tablet  Commonly known as:  RISPERDAL  Take 0.25 mg by mouth 2 (two) times daily.     vitamin B-12 1000 MCG tablet  Commonly known as:  CYANOCOBALAMIN  Take 1,000 mcg by mouth daily.     vitamin C 500 MG tablet  Commonly known as:  ASCORBIC ACID  Take 1,000 mg by mouth daily.        Meds ordered this encounter  Medications  . benzonatate (TESSALON) 100 MG capsule    Sig: Take 100 mg by mouth 3 (three) times daily.  Marland Kitchen acetaminophen (TYLENOL) 500 MG tablet    Sig: Take 1,000 mg by mouth 3 (three) times daily. Tylenol 500 mg   2 tabs po TID     There is no immunization history on file for this patient.  Social History  Substance Use Topics  . Smoking status: Never Smoker   . Smokeless  tobacco: Not on file  . Alcohol Use: No    Family history is + CAD    Review of Systems  DATA OBTAINED: from  Nurse,Patient GENERAL:  no fevers, fatigue, appetite changes SKIN: No itching, rash--lower extremity cellulitis appears to have resolved EYES: No eye pain, redness, discharge EARS: No earache, tinnitus, change in hearing NOSE: No congestion, drainage or bleeding  MOUTH/THROAT: No mouth or tooth pain, No sore throat RESPIRATORY: No cough, wheezing,  Does not complain of shortness of breath CARDIAC: No chest pain, palpitations  baseline, lower extremity edema  GI: No abdominal pain, No N/V/D or constipation, No heartburn or reflux  GU: No dysuria, frequency or urgency, or incontinence  MUSCULOSKELETAL does not complain of unrelieved joint pain NEUROLOGIC: No headache, dizziness or focal weakness PSYCHIATRIC:  Does not appear complain of any anxiety at this time   Filed Vitals:   09/27/14 2201  BP: 160/80  Pulse: 140  Resp: 18    SpO2 Readings from Last 1 Encounters:  09/27/14 93%        Physical Exam  GENERAL APPEARANCE: Alert, conversant,  No acute distress lying comfortably in bed.  SKIN: No diaphoresis rash; HEAD: Normocephalic, atraumatic  EYES: Conjunctiva/lids clear. Pupils round, reactive. EOMs intact.  EARS: External exam WNL, canals clear. Hearing grossly normal.  NOSE: No deformity or discharge.  MOUTH/THROAT: Oropharynx is clear mucous membranes moist RESPIRATORY: Shallow air entry does not really appear to have labored breathing CARDIOVASCULAR: Heart is tachycardiac irregular initially at around pulse of  140-on reevaluation this moderated to 120--then back up to 140----final evaluation before EMS arrived actually heart rate sounded  regular at 100     GASTROINTESTINAL: Abdomen is soft, non-tender, not distended w/ normal bowel sounds. Has a ventral hernia and a colostomy bag noted on the left GENITOURINARY: Bladder non tender, not distended   MUSCULOSKELETAL: No abnormal joints or musculature--moves all extremities 4--  NEUROLOGIC:  Cranial nerves 2-12 grossly intact. Moves all extremities cannot appreciate any lateralizing findings PSYCHIATRIC: Is pleasant and appropriate--   Her friends she does have hallucinations seeing things at night and are not in her room  Patient Active Problem List   Diagnosis Date Noted  . NSTEMI (non-ST elevated myocardial infarction) 09/27/2014  . Atrial fibrillation 09/27/2014  . Pleural effusion 09/27/2014  . Edema 08/19/2014  . Pain, generalized 08/19/2014  . Oxygen desaturation 08/19/2014  . Leukopenia 08/09/2014  . Normocytic anemia 08/09/2014  . Cellulitis 08/06/2014  . Cellulitis of both lower extremities 08/06/2014  . Prerenal acute renal failure 08/06/2014  . Rib fracture  08/06/2014  . Rib fractures   . Ventral hernia 07/28/2010  . S/P colostomy 07/28/2010  . Shingles 07/28/2010     08/20/2014.  WBC 3.0 hemoglobin 10.4 platelets 273.  Sodium 136 potassium 4.1 BUN 21 creatinine 0.7.  Albumin 2.2-PHOSPHATASE 317--ALT 119--AST 60         Dg Ribs Unilateral W/chest Right  08/06/2014   CLINICAL DATA:  Tripped and fall over walker.  EXAM: RIGHT RIBS AND CHEST - 3+ VIEW  COMPARISON:  None.  FINDINGS: Acute RIGHT posterior mildly displaced fourth through eighth rib fractures. Patient is osteopenic.  No pneumothorax. Mildly elevated RIGHT hemidiaphragm with strandy densities. LEFT lung base atelectasis/scarring. Cardiomediastinal silhouette is nonsuspicious.  IMPRESSION: Acute mildly displaced RIGHT posterior fourth through eighth rib fractures. No pneumothorax. RIGHT lung base atelectasis or possible contusion.  LEFT lung base atelectasis/ scarring.   Electronically Signed   By: Awilda Metro M.D.   On: 08/06/2014 05:37   Dg Tibia/fibula Left  08/06/2014   CLINICAL DATA:  Bilateral tib-fib pain.  Cellulitis.  EXAM: LEFT TIBIA AND FIBULA - 2 VIEW  COMPARISON:  None.   FINDINGS: Degenerative changes within the left knee. Diffuse soft tissue swelling. No fracture, subluxation or dislocation. No radiopaque foreign bodies.  IMPRESSION: Diffuse soft tissue swelling.  No acute bony abnormality.   Electronically Signed   By: Charlett Nose M.D.   On: 08/06/2014 10:12   Dg Tibia/fibula Right  08/06/2014   CLINICAL DATA:  Bilateral tibial/fibula pain.  Cellulitis.  EXAM: RIGHT TIBIA AND FIBULA - 2 VIEW  COMPARISON:  None.  FINDINGS: There is diffuse soft tissue swelling in the visualized right lower extremity. No fracture, suspicious focal osseous lesion, periosteal reaction or focal cortical erosions are seen in the right tibia or right fibula. Vascular calcifications are noted in the soft tissues. Degenerative changes are present at the right knee and right ankle joints. There is a small plantar right calcaneal spur. No malalignment is seen at the right knee or right ankle on the provided views.  IMPRESSION: Diffuse soft tissue swelling. No radiographic evidence of osteomyelitis.   Electronically Signed   By: Delbert Phenix M.D.   On: 08/06/2014 10:17   Dg Pelvis Portable  08/06/2014   CLINICAL DATA:  Larey Seat at home today, pelvic pain  EXAM: PORTABLE PELVIS 1-2 VIEWS  COMPARISON:  Portable exam 0906 hours compared to CT of 06/29/2010  FINDINGS: Diffuse osseous demineralization.  Hip and SI joint spaces grossly normal.  No acute fracture, dislocation or bone destruction.  Degenerative disc disease changes at poorly visualized lower lumbar spine.  Ostomy LEFT lower quadrant.  IMPRESSION: No definite acute osseous abnormalities.  Osseous demineralization with degenerative disc disease changes at visualized lower lumbar spine.   Electronically Signed   By: Ulyses Southward M.D.   On: 08/06/2014 10:10    .    Assessment and Plan  Assessment and plan.  #1-arrhythmia with tachycardia-concern here since patient does not really have an arrhythmia history-additional concern with previous  history of chest pain overnight apparently some diaphoresis although this was of short duration-one would wonder if she is going into atrial fibrillation or another arrhythmia and then back into sinus rhythm-this is concerning for risk for decompensation and an acute process-will send her to the ER for urgent evaluation  per-serial exams she was stable although again heart rate was concerning as noted above.  [id number]  .       Blaike Vickers C,

## 2014-09-28 ENCOUNTER — Encounter (HOSPITAL_COMMUNITY): Payer: Self-pay | Admitting: *Deleted

## 2014-09-28 ENCOUNTER — Inpatient Hospital Stay (HOSPITAL_COMMUNITY): Payer: Medicare Other

## 2014-09-28 DIAGNOSIS — I471 Supraventricular tachycardia: Secondary | ICD-10-CM

## 2014-09-28 DIAGNOSIS — R0989 Other specified symptoms and signs involving the circulatory and respiratory systems: Secondary | ICD-10-CM

## 2014-09-28 DIAGNOSIS — I4891 Unspecified atrial fibrillation: Secondary | ICD-10-CM

## 2014-09-28 LAB — TROPONIN I
TROPONIN I: 0.23 ng/mL — AB (ref <0.031)
Troponin I: 0.15 ng/mL — ABNORMAL HIGH (ref <0.031)
Troponin I: 0.13 ng/mL — ABNORMAL HIGH (ref <0.031)

## 2014-09-28 LAB — PROTIME-INR
INR: 1.14 (ref 0.00–1.49)
Prothrombin Time: 14.8 seconds (ref 11.6–15.2)

## 2014-09-28 LAB — CBC
HCT: 35.3 % — ABNORMAL LOW (ref 36.0–46.0)
HEMOGLOBIN: 11.3 g/dL — AB (ref 12.0–15.0)
MCH: 31.4 pg (ref 26.0–34.0)
MCHC: 32 g/dL (ref 30.0–36.0)
MCV: 98.1 fL (ref 78.0–100.0)
Platelets: 207 10*3/uL (ref 150–400)
RBC: 3.6 MIL/uL — AB (ref 3.87–5.11)
RDW: 15.5 % (ref 11.5–15.5)
WBC: 2.2 10*3/uL — ABNORMAL LOW (ref 4.0–10.5)

## 2014-09-28 LAB — COMPREHENSIVE METABOLIC PANEL
ALT: 10 U/L — AB (ref 14–54)
AST: 20 U/L (ref 15–41)
Albumin: 2.3 g/dL — ABNORMAL LOW (ref 3.5–5.0)
Alkaline Phosphatase: 79 U/L (ref 38–126)
Anion gap: 7 (ref 5–15)
BUN: 25 mg/dL — ABNORMAL HIGH (ref 6–20)
CHLORIDE: 107 mmol/L (ref 101–111)
CO2: 27 mmol/L (ref 22–32)
CREATININE: 0.53 mg/dL (ref 0.44–1.00)
Calcium: 8.6 mg/dL — ABNORMAL LOW (ref 8.9–10.3)
Glucose, Bld: 106 mg/dL — ABNORMAL HIGH (ref 65–99)
Potassium: 4.3 mmol/L (ref 3.5–5.1)
Sodium: 141 mmol/L (ref 135–145)
TOTAL PROTEIN: 5.2 g/dL — AB (ref 6.5–8.1)
Total Bilirubin: 0.5 mg/dL (ref 0.3–1.2)

## 2014-09-28 LAB — HEPARIN LEVEL (UNFRACTIONATED)
Heparin Unfractionated: 0.14 IU/mL — ABNORMAL LOW (ref 0.30–0.70)
Heparin Unfractionated: <0.1 IU/mL — ABNORMAL LOW (ref 0.30–0.70)

## 2014-09-28 LAB — LIPID PANEL
CHOLESTEROL: 173 mg/dL (ref 0–200)
HDL: 40 mg/dL — ABNORMAL LOW (ref >40)
LDL Cholesterol: 117 mg/dL — ABNORMAL HIGH (ref 0–99)
TRIGLYCERIDES: 78 mg/dL (ref <150)
Total CHOL/HDL Ratio: 4.3 RATIO
VLDL: 16 mg/dL (ref 0–40)

## 2014-09-28 LAB — MRSA PCR SCREENING: MRSA by PCR: NEGATIVE

## 2014-09-28 LAB — MAGNESIUM: Magnesium: 1.8 mg/dL (ref 1.7–2.4)

## 2014-09-28 LAB — T4, FREE: FREE T4: 0.84 ng/dL (ref 0.61–1.12)

## 2014-09-28 MED ORDER — METOPROLOL TARTRATE 12.5 MG HALF TABLET
12.5000 mg | ORAL_TABLET | Freq: Two times a day (BID) | ORAL | Status: DC
  Administered 2014-09-28 – 2014-10-01 (×6): 12.5 mg via ORAL
  Filled 2014-09-28 (×6): qty 1

## 2014-09-28 MED ORDER — HEPARIN (PORCINE) IN NACL 100-0.45 UNIT/ML-% IJ SOLN
1000.0000 [IU]/h | INTRAMUSCULAR | Status: DC
  Administered 2014-09-28: 850 [IU]/h via INTRAVENOUS
  Administered 2014-09-28 – 2014-09-30 (×2): 1000 [IU]/h via INTRAVENOUS
  Filled 2014-09-28 (×2): qty 250

## 2014-09-28 MED ORDER — HEPARIN BOLUS VIA INFUSION
2000.0000 [IU] | Freq: Once | INTRAVENOUS | Status: AC
  Administered 2014-09-28: 2000 [IU] via INTRAVENOUS
  Filled 2014-09-28: qty 2000

## 2014-09-28 MED ORDER — AMIODARONE HCL 200 MG PO TABS
400.0000 mg | ORAL_TABLET | Freq: Two times a day (BID) | ORAL | Status: DC
  Administered 2014-09-28 – 2014-09-29 (×2): 400 mg via ORAL
  Filled 2014-09-28 (×2): qty 2

## 2014-09-28 NOTE — Progress Notes (Signed)
Dear Doctor: Delton See This patient has been identified as a candidate for PICC for the following reason (s): IV therapy over 48 hours, poor veins/poor circulatory system (CHF, COPD, emphysema, diabetes, steroid use, IV drug abuse, etc.) and incompatible drugs (aminophyllin, TPN, heparin, given with an antibiotic) If you agree, please write an order for the indicated device. For any questions contact the Vascular Access Team at [phone] if no answer, please leave a message.  Thank you for supporting the early vascular access assessment program.

## 2014-09-28 NOTE — Progress Notes (Signed)
ANTICOAGULATION CONSULT NOTE - Initial Consult  Pharmacy Consult for heparin Indication: chest pain/ACS and atrial fibrillation  Allergies  Allergen Reactions  . Erythromycin Nausea And Vomiting  . Penicillins Other (See Comments)    Per MAR -   . Vicodin [Hydrocodone-Acetaminophen]     Patient reports history of significant hallucinations and agitation when taking narcotics in the past     Patient Measurements: Height: 5' (152.4 cm) Weight: 154 lb 8.7 oz (70.1 kg) IBW/kg (Calculated) : 45.5 Heparin Dosing Weight: 45.1kg  Vital Signs: Temp: 97.7 F (36.5 C) (09/27 0741) Temp Source: Oral (09/27 0741) BP: 129/40 mmHg (09/27 0800) Pulse Rate: 59 (09/27 0800)  Labs:  Recent Labs  09/27/14 1735 09/27/14 1815 09/27/14 2338 09/28/14 0010 09/28/14 0542 09/28/14 0815  HGB  --  11.9*  --   --   --   --   HCT  --  37.6  --   --   --   --   PLT  --  233  --   --   --   --   LABPROT  --   --   --  14.8  --   --   INR  --   --   --  1.14  --   --   HEPARINUNFRC  --   --   --   --   --  0.14*  CREATININE 0.70  --   --   --  0.53  --   TROPONINI  --  0.31* 0.23*  --  0.15*  --     Estimated Creatinine Clearance: 46.5 mL/min (by C-G formula based on Cr of 0.53).   Medical History: Past Medical History  Diagnosis Date  . S/P colostomy 2008  . Shingles   . Ventral hernia     a. s/p colostomy bag.  . Normocytic anemia   . Fall     a. 08/2014 c/b rib fractures.  . Cellulitis 08/2014  . Prerenal azotemia 08/2014    Medications:  Infusions:  . heparin      Assessment: 73 yof presented to the ED with CP and found to be in afib.To start IV heparin for anticoagulation. Baseline H/H slightly low and platelets are WNL. No anticoagulation PTA. Heparin gtt at 600 units/hr, 8 hour HL sub-therapeutic at 0.14.  Goal of Therapy:  Heparin level 0.3-0.7 units/ml Monitor platelets by anticoagulation protocol: Yes   Plan:  - Heparin bolus 2000 units IV x 1 - Increase Heparin  gtt 850 units/hr - Check an 8 hour heparin level - Daily heparin level and CBC   Sherron Monday, PharmD Clinical Pharmacy Resident Pager: [phone] 09/28/2014 9:50 AM

## 2014-09-28 NOTE — Progress Notes (Signed)
   09/28/14 1050  Clinical Encounter Type  Visited With Patient and family together  Visit Type Initial;Other (Comment)  Referral From Nurse  Recommendations (Pt family can call chaplain for AD)  Spiritual Encounters  Spiritual Needs Literature  Chaplain was paged for AD; Chaplain gave literature to family and patient; Chaplain can return once Pt family and friends arrive back

## 2014-09-28 NOTE — Progress Notes (Signed)
VASCULAR LAB PRELIMINARY  PRELIMINARY  PRELIMINARY  PRELIMINARY  Carotid duplex completed.    Preliminary report:  Bilateral:  1-39% ICA stenosis.  Vertebral artery flow is antegrade.     SLAUGHTER, [place], RVS 09/28/2014, 4:47 PM

## 2014-09-28 NOTE — Progress Notes (Signed)
Patient having short burst of atrial fibrillation returning to sinus rhythm .Patient asymptomatic denies chest pain ,shortness of breath or palpitations.Blood pressure 143/58.Call placed to Dr.McLean.

## 2014-09-28 NOTE — Progress Notes (Signed)
Paged by nurse, patient rate of 170s with BP of 99/23. Review of tele showed she was afib for about around 3pm. However, at the end of this episode tele was also concerning for SVT. Patient was laying comfortable and she was in sinus rhythm when I went into room.   Case discussed with Dr. Herbie Baltimore. Will switch coreg to metoprolol. Will get EP consult for possible antiarrhythmic initiation. Likely amiodarone. She is on risperidone.   Bhagat,Bhavinkumar, PAC

## 2014-09-28 NOTE — Consult Note (Signed)
Primary Care Physician: Paulino Rily, MD Referring Physician:  Admit Date: 09/27/2014  Reason for consultation:  [name] is a 81 y.o. female with a h/o ventral hernia s/p chronic colostomy bag (since 2008), normocytic anemia (on B12), and recent admission for mechanical fall, right-sided rib fractures, BLE cellulitis (LE duplex neg for DVT), pre-renal AKI who presented to [facility] 9/26 with chest pain, briefly recorded atrial fibrillation and elevated troponin. She was admitted in August for the above and went to SNF for active wound care and deconditioning. She denies history of frequent falls and reports that August's fall was due to losing her balance.  The morning of admission, the staff at [facility] nurising woke her around 4am for a bath. Shortly after that time she began to notice left sided chest pain with associated diaphoresis and SOB. This lasted one hour. She reports she called out for help. EMS was reportedly called and it was determined that she did not need to go to the hospital. She felt in the afternoon while working with rehab that she had much less stamina than usual - felt fatigued after walking. She was reportedly seen by the nursing facility doctor around 4pm who noticed her HR was irregular. EMS was called again who found her in atrial fib. Rate 129bpm by tracing, reported HR 90-160bpm per EMS. Patient reports she was given 2 full size aspirin but not clear what other intervention was done.   Today, she denies symptoms of palpitations, chest pain, shortness of breath, orthopnea, PND, lower extremity edema, dizziness, presyncope, syncope, or neurologic sequela.    Earlier today she had an episode of palpitations.  She noted mild chest discomfort as well as shortness of breath.  This was a short episode.  Past Medical History  Diagnosis Date  . S/P colostomy 2008  . Shingles   . Ventral hernia     a. s/p colostomy bag.  . Normocytic anemia   . Fall      a. 08/2014 c/b rib fractures.  . Cellulitis 08/2014  . Prerenal azotemia 08/2014   Past Surgical History  Procedure Laterality Date  . Colostomy    . Total abdominal hysterectomy      . aspirin EC  81 mg Oral Daily  . benzonatate  100 mg Oral TID  . metoprolol tartrate  12.5 mg Oral BID  . risperiDONE  0.25 mg Oral BID  . saccharomyces boulardii  250 mg Oral Daily  . sodium chloride  3 mL Intravenous Q12H  . vitamin B-12  1,000 mcg Oral Daily  . vitamin C  1,000 mg Oral Daily   . heparin 850 Units/hr (09/28/14 1009)    Allergies  Allergen Reactions  . Erythromycin Nausea And Vomiting  . Penicillins Other (See Comments)    Per MAR -   . Vicodin [Hydrocodone-Acetaminophen]     Patient reports history of significant hallucinations and agitation when taking narcotics in the past     Social History   Social History  . Marital Status: Single    Spouse Name: N/A  . Number of Children: N/A  . Years of Education: N/A   Occupational History  . Not on file.   Social History Main Topics  . Smoking status: Never Smoker   . Smokeless tobacco: Not on file  . Alcohol Use: No  . Drug Use: No  . Sexual Activity: Not on file   Other Topics Concern  . Not on file   Social History Narrative  Family History  Problem Relation Age of Onset  . Heart failure Mother     Took Lasix  . Heart disease Father     Patient thinks father had some sort of heart disease but not sure what kind. No CAD in siblings.  . Prostate cancer Brother   . Diabetes Brother     ROS- All systems are reviewed and negative except as per the HPI above  Physical Exam: Telemetry: Filed Vitals:   09/28/14 0800 09/28/14 1123 09/28/14 1200 09/28/14 1530  BP: 129/40 107/41 99/23 117/37  Pulse: 59 72 71 89  Temp:  97.9 F (36.6 C)    TempSrc:  Oral    Resp: Height:      Weight:      SpO2: 97% 100% 100% 96%    GEN- The patient is well appearing, alert and oriented x 3 today.    Head- normocephalic, atraumatic Eyes-  Sclera clear, conjunctiva pink Ears- hearing intact Oropharynx- clear Neck- supple, no JVP Lymph- no cervical lymphadenopathy Lungs- Clear to ausculation bilaterally, normal work of breathing Heart- Regular rate and rhythm, no murmurs, rubs or gallops, PMI not laterally displaced GI- soft, NT, ND, + BS Extremities- no clubbing, cyanosis, or edema MS- no significant deformity or atrophy Skin- no rash or lesion Psych- euthymic mood, full affect Neuro- strength and sensation are intact  EKG- sinus rhythm Tele-Atrial fibrillation  Narrow complex tachycardia, terminated with AV block  Labs:   Lab Results  Component Value Date   WBC 2.2* 09/28/2014   HGB 11.3* 09/28/2014   HCT 35.3* 09/28/2014   MCV 98.1 09/28/2014   PLT 207 09/28/2014    Recent Labs Lab 09/28/14 0542  NA 141  K 4.3  CL 107  CO2 27  BUN 25*  CREATININE 0.53  CALCIUM 8.6*  PROT 5.2*  BILITOT 0.5  ALKPHOS 79  ALT 10*  AST 20  GLUCOSE 106*   Lab Results  Component Value Date   TROPONINI 0.13* 09/28/2014    Lab Results  Component Value Date   CHOL 173 09/28/2014   Lab Results  Component Value Date   HDL 40* 09/28/2014   Lab Results  Component Value Date   LDLCALC 117* 09/28/2014   Lab Results  Component Value Date   TRIG 78 09/28/2014   Lab Results  Component Value Date   CHOLHDL 4.3 09/28/2014   Radiology:  CXR 09/27/14  1. Small to moderate RIGHT pleural effusion. Probable small LEFT pleural effusion. 2. RIGHT-greater-than-LEFT basilar atelectasis. Superimposed consolidation cannot be excluded. 3. Healing RIGHT-sided rib fractures.  Echo 09/28/14: - Left ventricle: The cavity size was normal. There was moderate concentric hypertrophy. Systolic function was normal. The estimated ejection fraction was in the range of 60% to 65%. Wall motion was normal; there were no regional wall motion abnormalities. There was an increased  relative contribution of atrial contraction to ventricular filling. Doppler parameters are consistent with abnormal left ventricular relaxation (grade 1 diastolic dysfunction). - Aortic valve: Poorly visualized. Trileaflet; normal thickness, mildly calcified leaflets. There was mild regurgitation. - Mitral valve: Calcified annulus. Mild focal calcification of the anterior leaflet (medial segment(s)). There was trivial regurgitation. - Pulmonic valve: There was mild regurgitation. - Pericardium, extracardiac: There was a left pleural effusion.  ASSESSMENT AND PLAN:   1. SVT: Telemetry shows narrow complex tachycardia at rates close to 170.  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  Discussed treatment options with  the patient and she wishes to try medical management prior to ablation.  Will start amiodarone 400 mg BID for one week then 200 mg per day.  2. Atrial fibrillation: Had AF at SNF as well as in the hospital.  Chest pain prior to presentation was possibly due to AF with RVR.  As the patient does not want ablation for SVT, will start amidarone for AF as well.  She has a CHADS2VA2Sc score of 3, so will need to be anticoagulated, likely with apixiban.  This patients CHA2DS2-VASc Score and unadjusted Ischemic Stroke Rate (% per year) is equal to 3.2 % stroke rate/year from a score of 3  Above score calculated as 1 point each if present [CHF, HTN, DM, Vascular=MI/PAD/Aortic Plaque, Age if 65-74, or Female] Above score calculated as 2 points each if present [Age > 75, or Stroke/TIA/TE]  Will Jorja Loa, MD 09/28/2014  4:26 PM

## 2014-09-28 NOTE — Progress Notes (Signed)
ANTICOAGULATION CONSULT NOTE - Follow Up Consult  Pharmacy Consult for heparin Indication: chest pain/ACS and afib  Allergies  Allergen Reactions  . Erythromycin Nausea And Vomiting  . Penicillins Other (See Comments)    Per MAR -   . Vicodin [Hydrocodone-Acetaminophen]     Patient reports history of significant hallucinations and agitation when taking narcotics in the past     Patient Measurements: Height: 5' (152.4 cm) Weight: 154 lb 8.7 oz (70.1 kg) IBW/kg (Calculated) : 45.5 Heparin Dosing Weight: 61 kg  Vital Signs: Temp: 98.7 F (37.1 C) (09/27 2004) Temp Source: Oral (09/27 2004) BP: 133/56 mmHg (09/27 1700) Pulse Rate: 75 (09/27 1700)  Labs:  Recent Labs  09/27/14 1735  09/27/14 1815 09/27/14 2338 09/28/14 0010 09/28/14 0542 09/28/14 0815 09/28/14 1059 09/28/14 1215 09/28/14 2036  HGB  --   --  11.9*  --   --   --   --  11.3*  --   --   HCT  --   --  37.6  --   --   --   --  35.3*  --   --   PLT  --   --  233  --   --   --   --  207  --   --   LABPROT  --   --   --   --  14.8  --   --   --   --   --   INR  --   --   --   --  1.14  --   --   --   --   --   HEPARINUNFRC  --   --   --   --   --   --  0.14*  --   --  <0.10*  CREATININE 0.70  --   --   --   --  0.53  --   --   --   --   TROPONINI  --   < > 0.31* 0.23*  --  0.15*  --   --  0.13*  --   < > = values in this interval not displayed.  Estimated Creatinine Clearance: 46.5 mL/min (by C-G formula based on Cr of 0.53).   Medications:  Scheduled:  . amiodarone  400 mg Oral BID  . aspirin EC  81 mg Oral Daily  . benzonatate  100 mg Oral TID  . metoprolol tartrate  12.5 mg Oral BID  . risperiDONE  0.25 mg Oral BID  . saccharomyces boulardii  250 mg Oral Daily  . sodium chloride  3 mL Intravenous Q12H  . vitamin B-12  1,000 mcg Oral Daily  . vitamin C  1,000 mg Oral Daily   Infusions:  . heparin 850 Units/hr (09/28/14 1009)    Assessment: 81 yo female with ACS and afib is currently on  subtherapeutic heparin.  Heparin level is <0.1.  Per RN, no problem with infusion.  Goal of Therapy:  Heparin level 0.3-0.7 units/ml Monitor platelets by anticoagulation protocol: Yes   Plan:  - Heparin 2000 units bolus x1, then increase heparin drip to 1000 units/hr - 8 hr heparin level  So, Tsz-Yin 09/28/2014,9:17 PM

## 2014-09-28 NOTE — Care Management Note (Signed)
Case Management Note  Patient Details  Name: [name] MRN: [id number] Date of Birth: 1928/01/24  Subjective/Objective:  Adm w mi                  Action/Plan:from nsg facility, soc worker ref placed   Expected Discharge Date:                  Expected Discharge Plan:     In-House Referral:     Discharge planning Services     Post Acute Care Choice:    Choice offered to:     DME Arranged:    DME Agency:     HH Arranged:    HH Agency:     Status of Service:     Medicare Important Message Given:    Date Medicare IM Given:    Medicare IM give by:    Date Additional Medicare IM Given:    Additional Medicare Important Message give by:     If discussed at Long Length of Stay Meetings, dates discussed:    Additional Comments:ur review done  Hanley Hays, RN 09/28/2014, 7:44 AM

## 2014-09-28 NOTE — Progress Notes (Signed)
Dr.McLean aware plan to monitor patient for now.Patient did refuse to take coreg last night.Per MD try to get patient to take coreg now.

## 2014-09-28 NOTE — Progress Notes (Signed)
Responded to follow up request to assist patient with completing advance Directives. Patient did not want to do living will.  Document were complete and copies given to patient. Nurse place a copy in patient chart.  Will follow as needed.   09/28/14 1400  Clinical Encounter Type  Visited With Patient and family together;Health care provider  Visit Type Initial;Spiritual support  Referral From Nurse  Spiritual Encounters  Spiritual Needs Literature  Advance Directives (For Healthcare)  Does patient have an advance directive? Yes  [name], [place], Pager [phone]

## 2014-09-28 NOTE — Progress Notes (Signed)
Pt is uncooperative at times, verbalized that heparin is poison that being given to her. Explained to her that the med. Is a part of her treatment to thin out her blood. Would stare on her pills when given and further claimed that is not doing any good to her.

## 2014-09-28 NOTE — Progress Notes (Signed)
Had an episode of svt hr- 170's claimed that she feels her heart beating fast. bp 90's systolic. PA made aware. After few min. went back to NSR   Hr- 90's. Pt. Claimed that she feels much better. Seen by PA. Continue to monitor.

## 2014-09-28 NOTE — Progress Notes (Signed)
Patient Name: [name] Date of Encounter: 09/28/2014  Active Problems:   Leukopenia   Normocytic anemia   NSTEMI (non-ST elevated myocardial infarction)   Atrial fibrillation   Pleural effusion    SUBJECTIVE  Denies chest pain, sob or palpitations. Wants to go home. She liver by her self at home.   CURRENT MEDS . aspirin EC  81 mg Oral Daily  . benzonatate  100 mg Oral TID  . carvedilol  3.125 mg Oral BID WC  . risperiDONE  0.25 mg Oral BID  . saccharomyces boulardii  250 mg Oral Daily  . sodium chloride  3 mL Intravenous Q12H  . vitamin B-12  1,000 mcg Oral Daily  . vitamin C  1,000 mg Oral Daily    OBJECTIVE  Filed Vitals:   09/28/14 0250 09/28/14 0321 09/28/14 0322 09/28/14 0741  BP: 143/58 117/23  154/51  Pulse: 122 84  69  Temp:   97.8 F (36.6 C) 97.7 F (36.5 C)  TempSrc:   Oral Oral  Resp: Height:      Weight:      SpO2: 97% 96%  99%    Intake/Output Summary (Last 24 hours) at 09/28/14 0826 Last data filed at 09/28/14 0800  Gross per 24 hour  Intake  112.7 ml  Output    100 ml  Net   12.7 ml   Filed Weights   09/27/14 2100 09/27/14 2315  Weight: 157 lb (71.215 kg) 154 lb 8.7 oz (70.1 kg)    PHYSICAL EXAM  General: Pleasant, NAD. Neuro: Alert and oriented X 3. Moves all extremities spontaneously. Psych: Normal affect. HEENT:  Normal  Neck: Supple without bruits or JVD. Lungs:  Resp regular and unlabored. Diminished breath sound bibasilar.  Heart: RRR no s3, s4, 2/6 SE murmur. Abdomen: Soft, non-tender, non-distended, BS + x 4.  Extremities: No clubbing, cyanosis. Trace soft BLE edema/erythema. DP/PT/Radials 2+ and equal bilaterally.  Accessory Clinical Findings  CBC  Recent Labs  09/27/14 1815  WBC 3.4*  NEUTROABS 1.6*  HGB 11.9*  HCT 37.6  MCV 97.4  PLT 233   Basic Metabolic Panel  Recent Labs  09/27/14 1735 09/28/14 0010 09/28/14 0542  NA 139  --  141  K 4.4  --  4.3  CL 105  --  107  CO2 23  --   27  GLUCOSE 132*  --  106*  BUN 26*  --  25*  CREATININE 0.70  --  0.53  CALCIUM 8.8*  --  8.6*  MG  --  1.8  --    Liver Function Tests  Recent Labs  09/28/14 0542  AST 20  ALT 10*  ALKPHOS 79  BILITOT 0.5  PROT 5.2*  ALBUMIN 2.3*   No results for input(s): LIPASE, AMYLASE in the last 72 hours. Cardiac Enzymes  Recent Labs  09/27/14 1815 09/27/14 2338 09/28/14 0542  TROPONINI 0.31* 0.23* 0.15*   Fasting Lipid Panel  Recent Labs  09/28/14 0542  CHOL 173  HDL 40*  LDLCALC 117*  TRIG 78  CHOLHDL 4.3   Thyroid Function Tests  Recent Labs  09/27/14 1735  TSH 6.374*    TELE  Sinus rhythm at rate of 70-80s. Transient in 120s  Radiology/Studies  Dg Chest Portable 1 View  09/27/2014   CLINICAL DATA:  Atrial fibrillation. Transient chest pain. Chest pain for 1 day. RIGHT-sided rib pain. Fractured RIGHT ribs.  EXAM: PORTABLE CHEST 1 VIEW  COMPARISON:  08/06/2014 chest radiograph.  FINDINGS: Small to moderate RIGHT pleural effusion or hemothorax is present. Associated atelectasis. Superimposed consolidation cannot be excluded. There also appears to be a LEFT pleural effusion with atelectasis in the retrocardiac region. Monitoring leads project over the chest. Some interval healing of RIGHT-sided rib fractures. Cardiopericardial silhouette is probably within normal limits allowing for projection.  IMPRESSION: 1. Small to moderate RIGHT pleural effusion. Probable small LEFT pleural effusion. 2. RIGHT-greater-than-LEFT basilar atelectasis. Superimposed consolidation cannot be excluded. 3. Healing RIGHT-sided rib fractures.   Electronically Signed   By: Andreas Newport M.D.   On: 09/27/2014 17:00    ASSESSMENT AND PLAN  1. Chest pain and elevated troponin, possibly representative of NSTEMI versus demand ischemia -  - peak of troponin was 0.31, now trending down.  Likely her symptoms related to transient atrial fibrillation.  - Continue IV heparin, pending  echocardiogram. Myoview tomorrow if abnormal echo.  - Continue Coreg  2. Newly recognized transient atrial fibrillation with RVR - CHADSVASC is at least 3 for age and female, but she also has elevated BP and glucose in the ER. - TSH was elevated however normal free T4. Pending  A1C.  - Continue BB - Continue heparin for now. She is not a good candidate of NOAC due to recent fall. Continue ASA as outpatient. She will follow up with Dr. Delton See as outpatient for further management.   3. Small-mod pleural effusions - question related to rapid rate versus recent rib fractures. No chest pain. CXR healing right rib fractures.   4. Normocytic anemia with decreased WBC - stable Hgb. WBC slightly improved from prior leukopenia but will need to be followed as outpatient by primary care.  5. Hyperglycemia -Pending A1C.  6. Recent cellulitis - patient reports leg appearance is chronic for her. Continue to ollow.  7. Abnormal TSH - Normal T4. Continue to follow with PCP  8. Elevated blood pressure -Relatively stable  9. Carotid bruits and systolic murmur - check 2D echo and carotid duplex as outpatient.   Dispo: She lives by her self. Will get CM consult for discharge planning.   Signed, Bhagat,Bhavinkumar PA-C   Agree with note by Chelsea Aus PA-C  Pt admitted with 1 hr CP after being awakened at 4 am at SNF to bathe. On IV hep. Trop min +. EKG w/o acute changes. Apparently had PAF at SNF, NSR here. CHA2DSVASc2 score high suggesting OAC benefit but I'm not sure she would be compliant. 2D pending. Soft outflow murmur. Prob Ao Sclerosis by exam. Pt does not want stress test or other blood draws. She exhibits paranoid ideation. If 2D Nl w/o WMA rec D/C home with OP stress.    Runell Gess, M.D., FACP, [facility], [facility] [address] [place], [place]  [address]  [phone] 09/28/2014 8:48 AM

## 2014-09-29 ENCOUNTER — Inpatient Hospital Stay (HOSPITAL_COMMUNITY): Payer: Medicare Other

## 2014-09-29 DIAGNOSIS — I4891 Unspecified atrial fibrillation: Secondary | ICD-10-CM

## 2014-09-29 DIAGNOSIS — R7989 Other specified abnormal findings of blood chemistry: Secondary | ICD-10-CM

## 2014-09-29 LAB — NM MYOCAR MULTI W/SPECT W/WALL MOTION / EF
CHL CUP STRESS STAGE 1 DBP: 66 mmHg
CHL CUP STRESS STAGE 1 SBP: 145 mmHg
CHL CUP STRESS STAGE 1 SPEED: 0 mph
CHL CUP STRESS STAGE 2 GRADE: 0 %
CHL CUP STRESS STAGE 4 DBP: 59 mmHg
CHL CUP STRESS STAGE 4 GRADE: 0 %
CHL CUP STRESS STAGE 4 SPEED: 0 mph
CHL CUP STRESS STAGE 5 HR: 89 {beats}/min
CHL CUP STRESS STAGE 5 SBP: 160 mmHg
CHL CUP STRESS STAGE 6 DBP: 58 mmHg
CHL CUP STRESS STAGE 6 HR: 80 {beats}/min
CHL CUP STRESS STAGE 6 SBP: 155 mmHg
CHL CUP STRESS STAGE 7 DBP: 59 mmHg
CHL CUP STRESS STAGE 7 SBP: 170 mmHg
Estimated workload: 1 METS
LV dias vol: 91 mL
LV sys vol: 41 mL
Peak HR: 77 {beats}/min
Percent of predicted max HR: 56 %
RATE: 0.1
Rest HR: 63 {beats}/min
SDS: 0
SRS: 2
SSS: 2
Stage 1 Grade: 0 %
Stage 1 HR: 65 {beats}/min
Stage 2 HR: 65 {beats}/min
Stage 2 Speed: 0 mph
Stage 3 DBP: 57 mmHg
Stage 3 Grade: 0 %
Stage 3 HR: 91 {beats}/min
Stage 3 SBP: 164 mmHg
Stage 3 Speed: 0 mph
Stage 4 HR: 162 {beats}/min
Stage 4 SBP: 163 mmHg
Stage 5 DBP: 61 mmHg
Stage 5 Grade: 0 %
Stage 5 Speed: 0 mph
Stage 6 Grade: 0 %
Stage 6 Speed: 0 mph
Stage 7 Grade: 0 %
Stage 7 HR: 78 {beats}/min
Stage 7 Speed: 0 mph
Stage 8 Grade: 0 %
Stage 8 HR: 77 {beats}/min
Stage 8 Speed: 0 mph
TID: 1.17

## 2014-09-29 LAB — CBC
HCT: 37.4 % (ref 36.0–46.0)
Hemoglobin: 11.7 g/dL — ABNORMAL LOW (ref 12.0–15.0)
MCH: 30.5 pg (ref 26.0–34.0)
MCHC: 31.3 g/dL (ref 30.0–36.0)
MCV: 97.7 fL (ref 78.0–100.0)
Platelets: 206 10*3/uL (ref 150–400)
RBC: 3.83 MIL/uL — ABNORMAL LOW (ref 3.87–5.11)
RDW: 15.2 % (ref 11.5–15.5)
WBC: 3.2 10*3/uL — ABNORMAL LOW (ref 4.0–10.5)

## 2014-09-29 LAB — HEPARIN LEVEL (UNFRACTIONATED)
Heparin Unfractionated: 0.34 IU/mL (ref 0.30–0.70)
Heparin Unfractionated: 0.35 IU/mL (ref 0.30–0.70)

## 2014-09-29 LAB — HEMOGLOBIN A1C
Hgb A1c MFr Bld: 6 % — ABNORMAL HIGH (ref 4.8–5.6)
MEAN PLASMA GLUCOSE: 126 mg/dL

## 2014-09-29 MED ORDER — TECHNETIUM TC 99M SESTAMIBI GENERIC - CARDIOLITE
10.0000 | Freq: Once | INTRAVENOUS | Status: AC | PRN
Start: 2014-09-29 — End: 2014-09-29
  Administered 2014-09-29: 10 via INTRAVENOUS

## 2014-09-29 MED ORDER — TECHNETIUM TC 99M SESTAMIBI GENERIC - CARDIOLITE
30.0000 | Freq: Once | INTRAVENOUS | Status: AC | PRN
  Administered 2014-09-29: 30 via INTRAVENOUS

## 2014-09-29 MED ORDER — REGADENOSON 0.4 MG/5ML IV SOLN
0.4000 mg | Freq: Once | INTRAVENOUS | Status: AC
  Administered 2014-09-29: 0.4 mg via INTRAVENOUS
  Filled 2014-09-29: qty 5

## 2014-09-29 MED ORDER — REGADENOSON 0.4 MG/5ML IV SOLN
INTRAVENOUS | Status: AC
  Administered 2014-09-29: 11:00:00
  Filled 2014-09-29: qty 5

## 2014-09-29 MED ORDER — FLECAINIDE ACETATE 100 MG PO TABS
100.0000 mg | ORAL_TABLET | Freq: Two times a day (BID) | ORAL | Status: DC
  Administered 2014-09-29 – 2014-10-01 (×4): 100 mg via ORAL
  Filled 2014-09-29 (×5): qty 1

## 2014-09-29 NOTE — Progress Notes (Signed)
SUBJECTIVE: The patient is doing well today.  At this time, she denies any recurrent chest pain, no shortness of breath, palpitations, or any new concerns.  She asks about going home.  Marland Kitchen amiodarone  400 mg Oral BID  . aspirin EC  81 mg Oral Daily  . metoprolol tartrate  12.5 mg Oral BID  . risperiDONE  0.25 mg Oral BID  . saccharomyces boulardii  250 mg Oral Daily  . sodium chloride  3 mL Intravenous Q12H  . vitamin B-12  1,000 mcg Oral Daily  . vitamin C  1,000 mg Oral Daily   . heparin 1,000 Units/hr (09/29/14 0800)    OBJECTIVE: Physical Exam: Filed Vitals:   09/29/14 0121 09/29/14 0400 09/29/14 0500 09/29/14 0736  BP:  106/34  142/47  Pulse: 60 58  66  Temp:   98.1 F (36.7 C) 97.7 F (36.5 C)  TempSrc:    Oral  Resp: Height:      Weight:   159 lb 6.3 oz (72.3 kg)   SpO2: 96% 97%  96%    Intake/Output Summary (Last 24 hours) at 09/29/14 1005 Last data filed at 09/29/14 0800  Gross per 24 hour  Intake  733.5 ml  Output      0 ml  Net  733.5 ml    Telemetry reveals sinus rhythm, transient SB in 30's  GEN- The patient is well appearing, alert and oriented x 3 today.   Head- normocephalic, atraumatic Eyes-  Sclera clear, conjunctiva pink Ears- hearing intact Lungs- Clear to ausculation bilaterally, normal work of breathing Heart- Regular rate and rhythm, no murmurs, rubs or gallops GI- soft, NT, ND, + BS Extremities- no clubbing, cyanosis, or edema Skin- no rash or lesion Psych- euthymic mood, full affect Neuro- intact grossly  LABS: Basic Metabolic Panel:  Recent Labs  40/98/11 1735 09/28/14 0010 09/28/14 0542  NA 139  --  141  K 4.4  --  4.3  CL 105  --  107  CO2 23  --  27  GLUCOSE 132*  --  106*  BUN 26*  --  25*  CREATININE 0.70  --  0.53  CALCIUM 8.8*  --  8.6*  MG  --  1.8  --    Liver Function Tests:  Recent Labs  09/28/14 0542  AST 20  ALT 10*  ALKPHOS 79  BILITOT 0.5  PROT 5.2*  ALBUMIN 2.3*   No results for  input(s): LIPASE, AMYLASE in the last 72 hours. CBC:  Recent Labs  09/27/14 1815 09/28/14 1059  WBC 3.4* 2.2*  NEUTROABS 1.6*  --   HGB 11.9* 11.3*  HCT 37.6 35.3*  MCV 97.4 98.1  PLT 233 207   Cardiac Enzymes:  BNP: Invalid input(s): POCBNP D-Dimer: No results for input(s): DDIMER in the last 72 hours. Hemoglobin A1C:  Recent Labs  09/28/14 0010  HGBA1C 6.0*   Fasting Lipid Panel:  Recent Labs  09/28/14 0542  CHOL 173  HDL 40*  LDLCALC 117*  TRIG 78  CHOLHDL 4.3   Thyroid Function Tests:  Recent Labs  09/27/14 1735  TSH 6.374*   Anemia Panel: No results for input(s): VITAMINB12, FOLATE, FERRITIN, TIBC, IRON, RETICCTPCT in the last 72 hours.  RADIOLOGY: Dg Chest Portable 1 View 09/27/2014   CLINICAL DATA:  Atrial fibrillation. Transient chest pain. Chest pain for 1 day. RIGHT-sided rib pain. Fractured RIGHT ribs.  EXAM: PORTABLE CHEST 1 VIEW  COMPARISON:  08/06/2014 chest radiograph.  FINDINGS: Small to moderate RIGHT pleural effusion or hemothorax is present. Associated atelectasis. Superimposed consolidation cannot be excluded. There also appears to be a LEFT pleural effusion with atelectasis in the retrocardiac region. Monitoring leads project over the chest. Some interval healing of RIGHT-sided rib fractures. Cardiopericardial silhouette is probably within normal limits allowing for projection.  IMPRESSION: 1. Small to moderate RIGHT pleural effusion. Probable small LEFT pleural effusion. 2. RIGHT-greater-than-LEFT basilar atelectasis. Superimposed consolidation cannot be excluded. 3. Healing RIGHT-sided rib fractures.   Electronically Signed   By: Andreas Newport M.D.   On: 09/27/2014 17:00   09/28/14: Echo:  Study Conclusions  - Left ventricle: The cavity size was normal. There was moderate concentric hypertrophy. Systolic function was normal. The estimated ejection fraction was in the range of 60% to 65%. Wall motion was normal; there were no  regional wall motion abnormalities. There was an increased relative contribution of atrial contraction to ventricular filling. Doppler parameters are consistent with abnormal left ventricular relaxation (grade 1 diastolic dysfunction). - Aortic valve: Poorly visualized. Trileaflet; normal thickness, mildly calcified leaflets. There was mild regurgitation. - Mitral valve: Calcified annulus. Mild focal calcification of the anterior leaflet (medial segment(s)). There was trivial regurgitation. - Pulmonic valve: There was mild regurgitation. - Pericardium, extracardiac: There was a left pleural effusion.   ASSESSMENT AND PLAN:  Active Problems:   1.Leukopenia  Deferred to primary care team  2. Normocytic anemia  Deferred to primary care team   3.NSTEMI (non-ST elevated myocardial infarction)  Likely demand ischemia with Rapid AF/SVT   4.Atrial fibrillation/SVT  On amiodarone PO, and a Heparin gtt  She had some CP associated with her tachycardia, will await myoview, if no ischemia will change her to Flecainide  She is on heparin gtt at this time, will switch to NOAC, likely Eliquis with CHADS2Vasc of 3  5. Pleural effusion  No sob, possibly secondary to RVR, recent fall with healing rib fractures    Continue as per the primary care team   Sheilah Pigeon, PA-C 09/29/2014 10:05 AM   I have seen and examined this patient with Francis Dowse.  Agree with above, note added to reflect my findings.  On exam, regular rate, lungs clear, no murmurs.  Stress test done and now switched to flecainide for atrial arrhythmias and atrial fibrillation.  Plan for anticoagulation as well for stroke prevention in atrial fibrillation.    Will M. Camnitz MD 09/29/2014 10:09 PM

## 2014-09-29 NOTE — Progress Notes (Signed)
Patient Name: [name] Date of Encounter: 09/29/2014  Active Problems:   Leukopenia   Normocytic anemia   NSTEMI (non-ST elevated myocardial infarction)   Atrial fibrillation   Pleural effusion    SUBJECTIVE  Denies chest pain, sob or palpitations. Agrees for Myoview today.   CURRENT MEDS . amiodarone  400 mg Oral BID  . aspirin EC  81 mg Oral Daily  . benzonatate  100 mg Oral TID  . metoprolol tartrate  12.5 mg Oral BID  . risperiDONE  0.25 mg Oral BID  . saccharomyces boulardii  250 mg Oral Daily  . sodium chloride  3 mL Intravenous Q12H  . vitamin B-12  1,000 mcg Oral Daily  . vitamin C  1,000 mg Oral Daily    OBJECTIVE  Filed Vitals:   09/29/14 0121 09/29/14 0400 09/29/14 0500 09/29/14 0736  BP:  106/34  142/47  Pulse: 60 58  66  Temp:   98.1 F (36.7 C) 97.7 F (36.5 C)  TempSrc:    Oral  Resp: Height:      Weight:   159 lb 6.3 oz (72.3 kg)   SpO2: 96% 97%  96%    Intake/Output Summary (Last 24 hours) at 09/29/14 0818 Last data filed at 09/29/14 0800  Gross per 24 hour  Intake  756.5 ml  Output      0 ml  Net  756.5 ml   Filed Weights   09/27/14 2100 09/27/14 2315 09/29/14 0500  Weight: 157 lb (71.215 kg) 154 lb 8.7 oz (70.1 kg) 159 lb 6.3 oz (72.3 kg)    PHYSICAL EXAM  General: Pleasant, NAD. Neuro: Alert and oriented X 3. Moves all extremities spontaneously. Psych: Normal affect. HEENT:  Normal  Neck: Supple without bruits or JVD. Lungs:  Resp regular and unlabored. Diminished breath sound bibasilar.  Heart: RRR no s3, s4, 2/6 SE murmur. Abdomen: Soft, non-tender, non-distended, BS + x 4.  Extremities: No clubbing, cyanosis. Trace soft BLE edema/erythema. DP/PT/Radials 2+ and equal bilaterally.  Accessory Clinical Findings  CBC  Recent Labs  09/27/14 1815 09/28/14 1059  WBC 3.4* 2.2*  NEUTROABS 1.6*  --   HGB 11.9* 11.3*  HCT 37.6 35.3*  MCV 97.4 98.1  PLT 233 207   Basic Metabolic Panel  Recent Labs  09/27/14 1735 09/28/14 0010 09/28/14 0542  NA 139  --  141  K 4.4  --  4.3  CL 105  --  107  CO2 23  --  27  GLUCOSE 132*  --  106*  BUN 26*  --  25*  CREATININE 0.70  --  0.53  CALCIUM 8.8*  --  8.6*  MG  --  1.8  --    Liver Function Tests  Recent Labs  09/28/14 0542  AST 20  ALT 10*  ALKPHOS 79  BILITOT 0.5  PROT 5.2*  ALBUMIN 2.3*   Cardiac Enzymes  Recent Labs  09/27/14 2338 09/28/14 0542 09/28/14 1215  TROPONINI 0.23* 0.15* 0.13*   Fasting Lipid Panel  Recent Labs  09/28/14 0542  CHOL 173  HDL 40*  LDLCALC 117*  TRIG 78  CHOLHDL 4.3   Thyroid Function Tests  Recent Labs  09/27/14 1735  TSH 6.374*    TELE  Sinus rhythm at rate of 70-80s. Transient in 120s  Radiology/Studies  Dg Chest Portable 1 View  09/27/2014   CLINICAL DATA:  Atrial fibrillation. Transient chest pain. Chest pain for 1 day. RIGHT-sided rib  pain. Fractured RIGHT ribs.  EXAM: PORTABLE CHEST 1 VIEW  COMPARISON:  08/06/2014 chest radiograph.  FINDINGS: Small to moderate RIGHT pleural effusion or hemothorax is present. Associated atelectasis. Superimposed consolidation cannot be excluded. There also appears to be a LEFT pleural effusion with atelectasis in the retrocardiac region. Monitoring leads project over the chest. Some interval healing of RIGHT-sided rib fractures. Cardiopericardial silhouette is probably within normal limits allowing for projection.  IMPRESSION: 1. Small to moderate RIGHT pleural effusion. Probable small LEFT pleural effusion. 2. RIGHT-greater-than-LEFT basilar atelectasis. Superimposed consolidation cannot be excluded. 3. Healing RIGHT-sided rib fractures.   Electronically Signed   By: Andreas Newport M.D.   On: 09/27/2014 17:00   LV EF: 60% -  65%  ------------------------------------------------------------------- Indications:   (I48.91).  ------------------------------------------------------------------- History:  PMH: Acquired from the patient  and from the patient&'s chart. Atrial fibrillation.  ------------------------------------------------------------------- Study Conclusions  - Left ventricle: The cavity size was normal. There was moderate concentric hypertrophy. Systolic function was normal. The estimated ejection fraction was in the range of 60% to 65%. Wall motion was normal; there were no regional wall motion abnormalities. There was an increased relative contribution of atrial contraction to ventricular filling. Doppler parameters are consistent with abnormal left ventricular relaxation (grade 1 diastolic dysfunction). - Aortic valve: Poorly visualized. Trileaflet; normal thickness, mildly calcified leaflets. There was mild regurgitation. - Mitral valve: Calcified annulus. Mild focal calcification of the anterior leaflet (medial segment(s)). There was trivial regurgitation. - Pulmonic valve: There was mild regurgitation. - Pericardium, extracardiac: There was a left pleural effusion.  ASSESSMENT AND PLAN  1. Chest pain and elevated troponin, possibly representative of NSTEMI versus demand ischemia -  - peak of troponin was 0.31, now trending down.  Likely her symptoms related to transient atrial fibrillation.  - Echo showed LV EF of 65-65%, grade 1 DD, no WM abnormality, mild aortic reguar.  She is NPO for [employer] today.  - Switched Coreg to metoprolol yesterday for better rate controlled. - On IV heparin  2. Newly recognized transient atrial fibrillation with RVR - CHADSVASC is at least 3 for age and female, but she also has elevated BP. - TSH was elevated however normal free T4.  A1C 6.0 - Switched Coreg to metoprolol yesterday for better rate controlled. Also started amiodarone 400 mg BID for one week then 200 mg per day. Overnight few short runs of SVT, no afib. Tele also showed transient bradycardia at rate of high 30s.  -  heparin for now. She is not a good candidate of NOAC  due to recent fall. EP recommended anticoagulation, likely with apixiban. Will discuss with MD  3. Small-mod pleural effusions - question related to rapid rate versus recent rib fractures. No chest pain. CXR healing right rib fractures.   4. Normocytic anemia with decreased WBC - stable Hgb. WBC slightly improved from prior leukopenia but will need to be followed as outpatient by primary care.  5. Hyperglycemia -H1c 6.0  6. Recent cellulitis - patient reports leg appearance is chronic for her. Continue to ollow.  7. Abnormal TSH - Normal T4. Continue to follow with PCP  8. Elevated blood pressure -Relatively stable  9. Carotid bruits - - Bilateral - 1% to 39 % ICA stenosis. vertebral artery flow is antegrade. Technically difficult study especially on the left due to tortuousity  10. 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 Patient denies ablation.  Dispo: She came from [facility] facility -> CM following  Signed, [employer]    Agree with note by Chelsea Aus PA-C  Events of yesterday noted. Pt had SVT which spontaneously broke. 2D nl. Apprec EP's input. Amio load initiated and BB changed. On IV hep. Plan Lexiscan myoview this AM. If low risk will Tx tele, ambulate and discuss OAC. Hopefully home 24-48 hours.    Runell Gess, M.D., FACP, [facility], [facility] [address] [place], [place]  [address]  [phone] 09/29/2014 9:35 AM

## 2014-09-29 NOTE — Clinical Social Work Note (Signed)
Clinical Social Work Assessment  Patient Details  Name: [name] MRN: [id number] Date of Birth: December 04, 1928  Date of referral:  09/29/14               Reason for consult:  Facility Placement                Permission sought to share information with:  Oceanographer granted to share information::  Yes, Verbal Permission Granted  Name::        Agency::  [facility]  Relationship::     Contact Information:     Housing/Transportation Living arrangements for the past 2 months:  Skilled Building surveyor of Information:  Patient Patient Interpreter Needed:  None Criminal Activity/Legal Involvement Pertinent to Current Situation/Hospitalization:  No - Comment as needed Significant Relationships:  Friend Lives with:  Facility Resident Do you feel safe going back to the place where you live?  Yes Need for family participation in patient care:  No (Coment)  Care giving concerns:  Pt admitted from [facility]   Social Worker assessment / plan:  CSW visited pt room and confirmed she was admitted from SNF. Pt informed CSW she is happy with SNF and plans to return at dc. Pt informed CSW she is neither short term no long term with facility. CSW unable to get clarification on this from pt but was able to speak with facility. They informed CSW pt was receiving ST rehab with facility and since finishing has chosen to pay privately to remain at SNF while she works on making her house appropriate/safe for dc. Facility confirms they are able to accept pt back when medically stable. They were made aware of potential for dc tomorrow. Pt did confirm she will need non-emergent ambulance transport back to facility at dc and is aware this is not a guaranteed service covered with insurance.   Employment status:  Retired Health and safety inspector:  Medicare PT Recommendations:  Not assessed at this time Information / Referral to  community resources:   (None needed at this time)  Patient/Family's Response to care:  Pt is agreeable to plan of care  Patient/Family's Understanding of and Emotional Response to Diagnosis, Current Treatment, and Prognosis:  Pt with fair insight on her condition. Pt in good spirits during CSW visit and ready to dc from the hospital.   Emotional Assessment Appearance:  Appears stated age Attitude/Demeanor/Rapport:  Other (Cooperative) Affect (typically observed):  Calm, Pleasant Orientation:  Oriented to Place, Oriented to  Time, Oriented to Situation, Oriented to Self Alcohol / Substance use:  Not Applicable Psych involvement (Current and /or in the community):  No (Comment)  Discharge Needs  Concerns to be addressed:  Discharge Planning Concerns Readmission within the last 30 days:  No Current discharge risk:  Chronically ill Barriers to Discharge:  Continued Medical Work up   [employer], LCSW [phone]

## 2014-09-29 NOTE — Progress Notes (Signed)
Myoview results reviewed with Dr Elberta Fortis Will stop Amiodarone and start Flecainide  twice daily  Dr Elberta Fortis to see again in the morning  Gypsy Balsam, NP 09/29/2014 3:41 PM  I have seen and examined this patient with Gypsy Balsam.  Agree with above, note added to reflect my findings.  Myoview negative, plan for flecainide twice daily with metoprolol.  Will M. Camnitz MD 09/29/2014 10:12 PM

## 2014-09-29 NOTE — Progress Notes (Signed)
ANTICOAGULATION CONSULT NOTE - Follow Up Consult  Pharmacy Consult for heparin Indication: chest pain/ACS and afib  Allergies  Allergen Reactions  . Erythromycin Nausea And Vomiting  . Penicillins Other (See Comments)    Per MAR -   . Vicodin [Hydrocodone-Acetaminophen]     Patient reports history of significant hallucinations and agitation when taking narcotics in the past     Patient Measurements: Height: 5' (152.4 cm) Weight: 159 lb 6.3 oz (72.3 kg) IBW/kg (Calculated) : 45.5 Heparin Dosing Weight: 61 kg  Vital Signs: Temp: 98.1 F (36.7 C) (09/28 1237) Temp Source: Oral (09/28 1237) BP: 164/55 mmHg (09/28 1235) Pulse Rate: 70 (09/28 1235)  Labs:  Recent Labs  09/27/14 1735  09/27/14 1815 09/27/14 2338 09/28/14 0010 09/28/14 0542 09/28/14 0815 09/28/14 1059 09/28/14 1215 09/28/14 2036 09/29/14 1440  HGB  --   --  11.9*  --   --   --   --  11.3*  --   --   --   HCT  --   --  37.6  --   --   --   --  35.3*  --   --   --   PLT  --   --  233  --   --   --   --  207  --   --   --   LABPROT  --   --   --   --  14.8  --   --   --   --   --   --   INR  --   --   --   --  1.14  --   --   --   --   --   --   HEPARINUNFRC  --   --   --   --   --   --  0.14*  --   --  <0.10* 0.34  CREATININE 0.70  --   --   --   --  0.53  --   --   --   --   --   TROPONINI  --   < > 0.31* 0.23*  --  0.15*  --   --  0.13*  --   --   < > = values in this interval not displayed.  Estimated Creatinine Clearance: 47.3 mL/min (by C-G formula based on Cr of 0.53).   Medications:  Scheduled:  . amiodarone  400 mg Oral BID  . aspirin EC  81 mg Oral Daily  . metoprolol tartrate  12.5 mg Oral BID  . risperiDONE  0.25 mg Oral BID  . saccharomyces boulardii  250 mg Oral Daily  . sodium chloride  3 mL Intravenous Q12H  . vitamin B-12  1,000 mcg Oral Daily  . vitamin C  1,000 mg Oral Daily   Infusions:  . heparin 1,000 Units/hr (09/29/14 1000)    Assessment: 81 yo female with a-fib and  r/o ACS. Is currently on therapeutic heparin.  Heparin level therapeutic at 0.34.  Per RN, no problem with infusion. Hg 11.3  Goal of Therapy:  Heparin level 0.3-0.7 units/ml Monitor platelets by anticoagulation protocol: Yes   Plan:  - Continue Heparin 1000 units/hr - 8 hr heparin level - F/u signs and symptoms of bleeding   Sherron Monday, PharmD Clinical Pharmacy Resident Pager: [phone] 09/29/2014 3:33 PM

## 2014-09-29 NOTE — Progress Notes (Signed)
Lexiscan completed.  SVT started 5:41 and terminated with carotid massage.  PO dose of AMIO given.  Dream Nodal, PAC

## 2014-09-30 LAB — HEPARIN LEVEL (UNFRACTIONATED): Heparin Unfractionated: 0.32 IU/mL (ref 0.30–0.70)

## 2014-09-30 LAB — CBC
HEMATOCRIT: 34.5 % — AB (ref 36.0–46.0)
Hemoglobin: 10.7 g/dL — ABNORMAL LOW (ref 12.0–15.0)
MCH: 30.4 pg (ref 26.0–34.0)
MCHC: 31 g/dL (ref 30.0–36.0)
MCV: 98 fL (ref 78.0–100.0)
Platelets: 200 10*3/uL (ref 150–400)
RBC: 3.52 MIL/uL — ABNORMAL LOW (ref 3.87–5.11)
RDW: 15.3 % (ref 11.5–15.5)
WBC: 2.4 10*3/uL — ABNORMAL LOW (ref 4.0–10.5)

## 2014-09-30 LAB — MAGNESIUM: MAGNESIUM: 1.8 mg/dL (ref 1.7–2.4)

## 2014-09-30 MED ORDER — APIXABAN 5 MG PO TABS
5.0000 mg | ORAL_TABLET | Freq: Two times a day (BID) | ORAL | Status: DC
  Administered 2014-09-30 – 2014-10-01 (×3): 5 mg via ORAL
  Filled 2014-09-30 (×3): qty 1

## 2014-09-30 NOTE — Progress Notes (Signed)
SUBJECTIVE: The patient sleeping comfortably and easily woken and conversational.  At this time, she denies any recurrent chest pain, no shortness of breath, or any new concerns.  She did feel her tachycardia yesterday with the stress test, but none since then.  She again asks about going home and getting out of bed to walk.  Current meds: . aspirin EC  81 mg Oral Daily  . flecainide  100 mg Oral Q12H  . metoprolol tartrate  12.5 mg Oral BID  . risperiDONE  0.25 mg Oral BID  . saccharomyces boulardii  250 mg Oral Daily  . sodium chloride  3 mL Intravenous Q12H  . vitamin B-12  1,000 mcg Oral Daily  . vitamin C  1,000 mg Oral Daily   . heparin 1,000 Units/hr (09/30/14 0014)    OBJECTIVE: Physical Exam: Filed Vitals:   09/30/14 0355 09/30/14 0400 09/30/14 0600 09/30/14 0728  BP:  106/39 131/44 110/23  Pulse:  61 46 60  Temp: 97.7 F (36.5 C)   97.7 F (36.5 C)  TempSrc: Oral   Oral  Resp:  Height:      Weight: 156 lb 15.5 oz (71.2 kg)     SpO2:  97% 98% 97%    Intake/Output Summary (Last 24 hours) at 09/30/14 0756 Last data filed at 09/30/14 0600  Gross per 24 hour  Intake    190 ml  Output    750 ml  Net   -560 ml    Telemetry reveals SB (47-48)-SR 60's, SVT 150-160  GEN- The patient is sleepy but wakes and is conversational, well appearing, alert and oriented x 3 today.   Head- normocephalic, atraumatic Eyes-  Sclera clear, conjunctiva pink Ears- hearing intact Lungs- Clear to ausculation bilaterally, normal work of breathing Heart- Regular rate and rhythm, no murmurs, rubs or gallops GI- soft, NT, ND, + BS, colostomy present Extremities- no clubbing, cyanosis, or edema Skin- no rash or lesion Psych- euthymic mood, full affect Neuro- intact grossly  LABS: Basic Metabolic Panel:  Recent Labs  09/81/19 1735 09/28/14 0010 09/28/14 0542  NA 139  --  141  K 4.4  --  4.3  CL 105  --  107  CO2 23  --  27  GLUCOSE 132*  --  106*  BUN 26*  --   25*  CREATININE 0.70  --  0.53  CALCIUM 8.8*  --  8.6*  MG  --  1.8  --    Liver Function Tests:  Recent Labs  09/28/14 0542  AST 20  ALT 10*  ALKPHOS 79  BILITOT 0.5  PROT 5.2*  ALBUMIN 2.3*   No results for input(s): LIPASE, AMYLASE in the last 72 hours. CBC:  Recent Labs  09/27/14 1815 09/28/14 1059  WBC 3.4* 2.2*  NEUTROABS 1.6*  --   HGB 11.9* 11.3*  HCT 37.6 35.3*  MCV 97.4 98.1  PLT 233 207   Cardiac Enzymes:  BNP: Invalid input(s): POCBNP D-Dimer: No results for input(s): DDIMER in the last 72 hours. Hemoglobin A1C:  Recent Labs  09/28/14 0010  HGBA1C 6.0*   Fasting Lipid Panel:  Recent Labs  09/28/14 0542  CHOL 173  HDL 40*  LDLCALC 117*  TRIG 78  CHOLHDL 4.3   Thyroid Function Tests:  Recent Labs  09/27/14 1735  TSH 6.374*   Anemia Panel: No results for input(s): VITAMINB12, FOLATE, FERRITIN, TIBC, IRON, RETICCTPCT in the last 72 hours.  RADIOLOGY: Dg Chest Portable 1  View 09/27/2014   CLINICAL DATA:  Atrial fibrillation. Transient chest pain. Chest pain for 1 day. RIGHT-sided rib pain. Fractured RIGHT ribs.  EXAM: PORTABLE CHEST 1 VIEW  COMPARISON:  08/06/2014 chest radiograph.  FINDINGS: Small to moderate RIGHT pleural effusion or hemothorax is present. Associated atelectasis. Superimposed consolidation cannot be excluded. There also appears to be a LEFT pleural effusion with atelectasis in the retrocardiac region. Monitoring leads project over the chest. Some interval healing of RIGHT-sided rib fractures. Cardiopericardial silhouette is probably within normal limits allowing for projection.  IMPRESSION: 1. Small to moderate RIGHT pleural effusion. Probable small LEFT pleural effusion. 2. RIGHT-greater-than-LEFT basilar atelectasis. Superimposed consolidation cannot be excluded. 3. Healing RIGHT-sided rib fractures.   Electronically Signed   By: Andreas Newport M.D.   On: 09/27/2014 17:00   09/28/14: Echo:  Study Conclusions  - Left  ventricle: The cavity size was normal. There was moderate concentric hypertrophy. Systolic function was normal. The estimated ejection fraction was in the range of 60% to 65%. Wall motion was normal; there were no regional wall motion abnormalities. There was an increased relative contribution of atrial contraction to ventricular filling. Doppler parameters are consistent with abnormal left ventricular relaxation (grade 1 diastolic dysfunction). - Aortic valve: Poorly visualized. Trileaflet; normal thickness, mildly calcified leaflets. There was mild regurgitation. - Mitral valve: Calcified annulus. Mild focal calcification of the anterior leaflet (medial segment(s)). There was trivial regurgitation. - Pulmonic valve: There was mild regurgitation. - Pericardium, extracardiac: There was a left pleural effusion.  09/29/14: Lexiscan Stress Myoview Overall Study Impression Myocardial perfusion is normal. The study is normal. This is a low risk study. Overall left ventricular systolic function was normal. LV cavity size is normal. The left ventricular ejection fraction is normal (55-65%). There is no prior study for comparison.   ASSESSMENT AND PLAN:  Active Problems:   1.Leukopenia  Deferred to primary care team  2. Normocytic anemia  Deferred to primary care team   3.NSTEMI (non-ST elevated myocardial infarction)  Likely demand ischemia with Rapid AF/SVT   4.Atrial fibrillation/SVT  On Flecainide, and a Heparin gtt  She had some CP associated with her tachycardia, stress test was negative and she is now on Flecainide.      SB/SR on her telemetry without symptoms of bradycardia, will have her ambulate today and monitor rate and rhythm  She is on heparin gtt at this time, will switch to Eliquis today with CHADS2Vasc of 3.   Discussed risk benefit of anticoagulation with the patient, she has no bleeding history and is agreeable.  Anticipate discharge back to SNF  tomorrow from an EP standpoint  5. Pleural effusion  No sob, possibly secondary to RVR, recent fall with healing rib fractures    Continue as per the primary care team   Sheilah Pigeon, PA-C 09/30/2014 7:56 AM   I have seen and examined this patient with Francis Dowse.  Agree with above, note added to reflect my findings.  On exam, RRR, clear lungs.  Had negative stress testing yesterday so will start Flecainide.  Had tachycardia yesterday, will continue to monitor after change to flecainide.  Will start Eliquis for atrial fibrillation.    Will M. Camnitz MD 09/30/2014 1:37 PM

## 2014-09-30 NOTE — Progress Notes (Signed)
Pt has transfer orders, rm available. Pt informed and verbalized understanding. Belongings packed. Report called to 2W RN. Pt transferred to new room via bed with belongings. Prior to transfer, pt anxious about being on room air, sats 96 - 97%. Explained to pt to let RN know if she needs oxygen, pt VU. Will cont to monitor.

## 2014-09-30 NOTE — Discharge Instructions (Signed)

## 2014-09-30 NOTE — Progress Notes (Signed)
ANTICOAGULATION CONSULT NOTE - Follow Up Consult  Pharmacy Consult for heparin Indication: Afib and ACS   Labs:  Recent Labs  09/27/14 1735  09/27/14 1815 09/27/14 2338 09/28/14 0010 09/28/14 0542  09/28/14 1059 09/28/14 1215 09/28/14 2036 09/29/14 1440 09/29/14 2328  HGB  --   --  11.9*  --   --   --   --  11.3*  --   --   --   --   HCT  --   --  37.6  --   --   --   --  35.3*  --   --   --   --   PLT  --   --  233  --   --   --   --  207  --   --   --   --   LABPROT  --   --   --   --  14.8  --   --   --   --   --   --   --   INR  --   --   --   --  1.14  --   --   --   --   --   --   --   HEPARINUNFRC  --   --   --   --   --   --   < >  --   --  <0.10* 0.34 0.35  CREATININE 0.70  --   --   --   --  0.53  --   --   --   --   --   --   TROPONINI  --   < > 0.31* 0.23*  --  0.15*  --   --  0.13*  --   --   --   < > = values in this interval not displayed.   Assessment/Plan:  81yo female remains therapeutic on heparin. Will continue gtt at current rate and confirm stable with am labs.   Vernard Gambles, PharmD, BCPS  09/30/2014,12:01 AM

## 2014-09-30 NOTE — Evaluation (Signed)
Physical Therapy Evaluation Patient Details Name: [name] MRN: [id number] DOB: 1928-08-24 Today's Date: 09/30/2014   History of Present Illness  [name] is a 81 y.o. female with history of colostomy fall with rib fx 1 month ago who D/C'd to SNF adn was admitted with CP and NSTEMI  Clinical Impression  Pt with impaired cognition, problem solving and command following. Pt states she has been getting up to [facility] at [facility] and unable to carry over instruction from that transfer to standing from bed or pivot to recliner. Pt with decreased strength, balance and functional mobility who currently requires 2 person mod-max assist for mobility with significant posterior lean who will benefit from acute therapy to maximize balance, transfers and function to decrease burden of care.     Follow Up Recommendations SNF;Supervision for mobility/OOB    Equipment Recommendations  None recommended by PT    Recommendations for Other Services       Precautions / Restrictions Precautions Precautions: Fall Restrictions Weight Bearing Restrictions: No      Mobility  Bed Mobility Overal bed mobility: Needs Assistance Bed Mobility: Supine to Sit     Supine to sit: Mod assist;HOB elevated     General bed mobility comments: max cues with assist to roll to left with rail to achieve transfers and elevate trunk from surface mod physical assist, slow response with increased time  Transfers Overall transfer level: Needs assistance   Transfers: Sit to/from [employer] Transfers Sit to Stand: Mod assist;+2 physical assistance   Squat pivot transfers: Max assist;+2 physical assistance     General transfer comment: pt with max cues for anterior translation and sequence. In standing pt with flexed posture and pushing posteriorly with max +2 to pivot pelvis with hand over hand guidance of hand to chair for pivot bed to chair. Max cues for reciprocal scooting back in  chair  Ambulation/Gait                Stairs            Wheelchair Mobility    Modified Rankin (Stroke Patients Only)       Balance Overall balance assessment: Needs assistance;History of Falls   Sitting balance-Leahy Scale: Poor   Postural control: Posterior lean;Left lateral lean   Standing balance-Leahy Scale: Poor                               Pertinent Vitals/Pain Pain Assessment: 0-10 Pain Score: 3  Pain Location: right ribs and post shingles area of right flank Pain Descriptors / Indicators: Sore Pain Intervention(s): Repositioned    Home Living Family/patient expects to be discharged to:: Skilled nursing facility                      Prior Function Level of Independence: Needs assistance   Gait / Transfers Assistance Needed: pt reports she was walking up to 125' with PT and RW at sNF. mod assist transfers and lately limited to [facility]. Unsure of assist level   ADL's / Homemaking Assistance Needed: total assist for bathing and dressing at SNF  Comments: one month ago pt was mod I and living on her own     Hand Dominance        Extremity/Trunk Assessment   Upper Extremity Assessment: Generalized weakness           Lower Extremity Assessment: Generalized weakness (3-4/5 all myotomes  bil LE)      Cervical / Trunk Assessment: Kyphotic  Communication   Communication: No difficulties  Cognition Arousal/Alertness: Awake/alert Behavior During Therapy: WFL for tasks assessed/performed Overall Cognitive Status: Impaired/Different from baseline Area of Impairment: Memory;Attention;Safety/judgement;Problem solving   Current Attention Level: Sustained     Safety/Judgement: Decreased awareness of safety;Decreased awareness of deficits   Problem Solving: Slow processing;Decreased initiation;Difficulty sequencing;Requires verbal cues;Requires tactile cues General Comments: pt with maintained posterior lean throughout despite  max multimodal cues and assist, slow response to commands and limited following    General Comments      Exercises General Exercises - Lower Extremity Long Arc Quad: AROM;Both;10 reps;Seated Hip Flexion/Marching: AROM;Both;10 reps;Seated      Assessment/Plan    PT Assessment Patient needs continued PT services  PT Diagnosis Difficulty walking;Generalized weakness;Altered mental status   PT Problem List Decreased strength;Decreased activity tolerance;Decreased mobility;Decreased cognition;Decreased safety awareness;Obesity;Pain;Decreased balance;Decreased coordination;Decreased knowledge of use of DME  PT Treatment Interventions Therapeutic activities;Functional mobility training;Therapeutic exercise;Balance training;Patient/family education;Cognitive remediation;Gait training   PT Goals (Current goals can be found in the Care Plan section) Acute Rehab PT Goals Patient Stated Goal: be able to walk PT Goal Formulation: With patient Time For Goal Achievement: 10/14/14 Potential to Achieve Goals: Fair    Frequency Min 2X/week   Barriers to discharge Decreased caregiver support      Co-evaluation               End of Session Equipment Utilized During Treatment: Gait belt Activity Tolerance: Patient limited by fatigue Patient left: in chair;with call bell/phone within reach;with chair alarm set Nurse Communication: Mobility status;Need for lift equipment         Time: 1610-9604 PT Time Calculation (min) (ACUTE ONLY): 27 min   Charges:   PT Evaluation $Initial PT Evaluation Tier I: 1 Procedure PT Treatments $Therapeutic Activity: 8-22 mins   PT G Codes:        Delorse Lek 09/30/2014, 1:48 PM Delaney Meigs, PT [phone]

## 2014-09-30 NOTE — Progress Notes (Signed)
1330 PT working with pt now. We will let them evaluate and follow. Luetta Nutting RN BSN 09/30/2014 1:31 PM

## 2014-09-30 NOTE — Care Management Note (Signed)
Case Management Note  Patient Details  Name: [name] MRN: [id number] Date of Birth: May 16, 1928  Subjective/Objective:  Adm w mi                  Action/Plan:from nsg facility, soc worker ref placed   Expected Discharge Date:                  Expected Discharge Plan:  Skilled Nursing Facility  In-House Referral:  Clinical Social Work  Discharge planning Services     Post Acute Care Choice:    Choice offered to:     DME Arranged:    DME Agency:     HH Arranged:    HH Agency:     Status of Service:     Medicare Important Message Given:  Yes-second notification given Date Medicare IM Given:    Medicare IM give by:    Date Additional Medicare IM Given:    Additional Medicare Important Message give by:     If discussed at Long Length of Stay Meetings, dates discussed:    Additional Comments:ur review done Pt transferred to 2 west 09/30/14, discharge plan remains to return to SNF Cherylann Parr, RN 09/30/2014, 2:31 PM

## 2014-09-30 NOTE — Progress Notes (Signed)
CSW (Clinical Child psychotherapist) received new consult. Pt with no change in needs. Please see assessment from 09/28 for more information. Pt has bed available at SNF when ready for dc.  Poonum Ambelal, LCSW [phone]

## 2014-09-30 NOTE — Care Management Important Message (Signed)
Important Message  Patient Details  Name: [name] MRN: [id number] Date of Birth: Apr 10, 1928   Medicare Important Message Given:  Yes-second notification given    Orson Aloe 09/30/2014, 11:52 AM

## 2014-09-30 NOTE — Progress Notes (Signed)
Pt received from 2 heartto2w12, oriented to the room and unit, vitals stables. Will continue to monitor pt.

## 2014-10-01 MED ORDER — FLECAINIDE ACETATE 100 MG PO TABS: 100.0000 mg | ORAL_TABLET | Freq: Two times a day (BID) | ORAL | Status: AC

## 2014-10-01 MED ORDER — RISPERIDONE 0.25 MG PO TABS: 0.2500 mg | ORAL_TABLET | Freq: Two times a day (BID) | ORAL | Status: AC

## 2014-10-01 MED ORDER — RISPERIDONE 0.25 MG PO TABS: 0.2500 mg | ORAL_TABLET | Freq: Two times a day (BID) | ORAL | Status: DC

## 2014-10-01 MED ORDER — METOPROLOL TARTRATE 25 MG PO TABS: 12.5000 mg | ORAL_TABLET | Freq: Two times a day (BID) | ORAL | Status: AC

## 2014-10-01 MED ORDER — APIXABAN 5 MG PO TABS: 5.0000 mg | ORAL_TABLET | Freq: Two times a day (BID) | ORAL | Status: AC

## 2014-10-01 NOTE — Clinical Social Work Note (Signed)
Patient to be d/c'ed today to [facility].  Patient and family agreeable to plans will transport via ems RN to call report.  Patient did not want CSW to call anyone to let them know she was discharging.  She will call them onces she returns to [employer].  Windell Moulding, MSW, Theresia Majors [phone]

## 2014-10-01 NOTE — Care Management Note (Signed)
Case Management Note  Patient Details  Name: [name] MRN: [id number] Date of Birth: [birth year]  Subjective/Objective:  Adm w mi                  Action/Plan:from nsg facility, soc worker ref placed   Expected Discharge Date:                  Expected Discharge Plan:  Skilled Nursing Facility  In-House Referral:  Clinical Social Work  Discharge planning Services     Post Acute Care Choice:    Choice offered to:     DME Arranged:    DME Agency:     HH Arranged:    HH Agency:     Status of Service:  Complete, will sign off  Medicare Important Message Given:  Yes-second notification given Date Medicare IM Given:    Medicare IM give by:    Date Additional Medicare IM Given:    Additional Medicare Important Message give by:     If discussed at Long Length of Stay Meetings, dates discussed:    Additional Comments:ur review done 10/01/2014 Pt will discharge to SNF today  09/30/14 Pt transferred to 2 west 09/30/14, discharge plan remains to return to SNF Cherylann Parr, RN 10/01/2014, 12:12 PM

## 2014-10-01 NOTE — Discharge Summary (Signed)
ELECTROPHYSIOLOGY PROCEDURE DISCHARGE SUMMARY    Patient ID: NURI BRANCA,  MRN: [id number], DOB/AGE: 01-03-1928 81 y.o.  Admit date: 09/27/2014 Discharge date: 10/01/2014  Primary Care Physician: Paulino Rily, MD  Primary Cardiologist: Dr. Delton See Electrophysiologist: Dr. Elberta Fortis  Primary Discharge Diagnosis:  1. PAFib with RVR, SVT     CHADS2VASC = at least 3-4     On Metoprolol, Flecainide and Eliquis.       Remains in SR     She has planned f/u with AFib clinic in 2 weeks, and 44mo f/u with EP 2. CP, abnormal Troponin, felt secondary to RVR with her AF     Resolved with negative myoview 3. Pleural effusion     No SOB, felt secondary to RVR with her AFib,   Secondary Discharge Diagnosis:  1. HTN ?new     Well controlled 2. Recent fall with rib fractures (healing)     Continue therapy as per her SNF PMD     The pt denies this as a recurrent issue, denies multiple falls 3. Leukopenia     F/u out patient with her PMD 4. Normocytic Anemia     F/u with her PMD  Allergies  Allergen Reactions  . Erythromycin Nausea And Vomiting  . Penicillins Other (See Comments)    Per MAR -   . Vicodin [Hydrocodone-Acetaminophen]     Patient reports history of significant hallucinations and agitation when taking narcotics in the past      Procedures This Admission: No admission procedures for hospital encounter.   HPI/Hospital course:  The pt was brought to [facility] admitted 09/27/14 with complaints of CP, SOB.  She is a resident at a SNF, she was noted to have fast, irregular heart beat by staff and rescue was called she was found to be in rapid AF, here in the hospital she was also observed to have an SVT.  Cardiology service was called and she was started on amiodarone and heparin gtt. She had elevated Troponin and pleural effusion on her CXR both felt to be secondary to her rapid AFib without ongoing c/o CP or SOB in SR.  Electrophysiology consult was requested for rate/rhythm  control options and management.  She underwent Lexiscan stress myoview that was negative for any ischemia, and was changed from amiodarone to Flecainide, and her heparing gtt changed to Eliquis.  She has been feeling well, she has not had any recurrent AFib or SVT over the last 24hours, her BP appears stable.  The pt feels well, and is asking to go home.   She has not had any further CP, no palpitations and denies any rest SOB, she states she slept well last nigth denies any orthopnea or PND.  She worked with PT yesterday and states she felt well without SOB or CP.  The patient had a recent fall with trauma, though denies falls as a recurrent problem, states she has been working with therapy at the SNF and her stamina and strength continues to improve.  The patient as seen by Dr. Elberta Fortis who feels she is stable to discharge back to her SNF.    Physical Exam: Filed Vitals:   09/30/14 2112 10/01/14 0555 10/01/14 0950 10/01/14 1129  BP: 135/46 120/49 107/46   Pulse: 72 56 58 62  Temp: 97.8 F (36.6 C) 98 F (36.7 C) 98.2 F (36.8 C)   TempSrc: Oral Oral Oral   Resp: Height:      Weight:  156 lb 4.9 oz (70.9 kg)    SpO2: 91% 96% 96%     GEN- The patient is well appearing, elderly, alert and oriented x 3 today.   HEENT: normocephalic, atraumatic; sclera clear, conjunctiva pink; hearing intact Lungs- slightly diminished at the bases, otherwise clear to ausculation bilaterally, normal work of breathing.  No wheezes, rales, rhonchi are appreciated, no crackles Heart- Regular rate and rhythm, no murmurs, rubs or gallops GI- soft, non-tender, non-distended, bowel sounds present, colostomy present Extremities- no clubbing, cyanosis, or edema; she has some mild erythema that is reported as chronic MS- no significant deformity or atrophy Skin- warm and dry, no rash or lesion Psych- euthymic mood, full affect Neuro- no gross deficits notes    Labs:   Lab Results  Component Value Date     WBC 2.4* 09/30/2014   HGB 10.7* 09/30/2014   HCT 34.5* 09/30/2014   MCV 98.0 09/30/2014   PLT 200 09/30/2014     Recent Labs Lab 09/28/14 0542  NA 141  K 4.3  CL 107  CO2 27  BUN 25*  CREATININE 0.53  CALCIUM 8.6*  PROT 5.2*  BILITOT 0.5  ALKPHOS 79  ALT 10*  AST 20  GLUCOSE 106*   RADIOLOGY: Dg Chest Portable 1 View 09/27/2014 CLINICAL DATA: Atrial fibrillation. Transient chest pain. Chest pain for 1 day. RIGHT-sided rib pain. Fractured RIGHT ribs. EXAM: PORTABLE CHEST 1 VIEW COMPARISON: 08/06/2014 chest radiograph. FINDINGS: Small to moderate RIGHT pleural effusion or hemothorax is present. Associated atelectasis. Superimposed consolidation cannot be excluded. There also appears to be a LEFT pleural effusion with atelectasis in the retrocardiac region. Monitoring leads project over the chest. Some interval healing of RIGHT-sided rib fractures. Cardiopericardial silhouette is probably within normal limits allowing for projection. IMPRESSION: 1. Small to moderate RIGHT pleural effusion. Probable small LEFT pleural effusion. 2. RIGHT-greater-than-LEFT basilar atelectasis. Superimposed consolidation cannot be excluded. 3. Healing RIGHT-sided rib fractures. Electronically Signed By: Andreas Newport M.D. On: 09/27/2014 17:00   Echo 09/28/14: - Left ventricle: The cavity size was normal. There was moderate concentric hypertrophy. Systolic function was normal. The estimated ejection fraction was in the range of 60% to 65%. Wall motion was normal; there were no regional wall motion abnormalities. There was an increased relative contribution of atrial contraction to ventricular filling. Doppler parameters are consistent with abnormal left ventricular relaxation (grade 1 diastolic dysfunction). - Aortic valve: Poorly visualized. Trileaflet; normal thickness, mildly calcified leaflets. There was mild regurgitation. - Mitral valve: Calcified annulus. Mild  focal calcification of the anterior leaflet (medial segment(s)). There was trivial regurgitation. - Pulmonic valve: There was mild regurgitation. - Pericardium, extracardiac: There was a left pleural effusion.  09/29/14: Lexiscan Stress Myoview Overall Study Impression Myocardial perfusion is normal. The study is normal. This is a low risk study. Overall left ventricular systolic function was normal. LV cavity size is normal. The left ventricular ejection fraction is normal (55-65%). There is no prior study for comparison.  Discharge Medications:    Medication List    TAKE these medications        acetaminophen 500 MG tablet  Commonly known as:  TYLENOL  Take 1,000 mg by mouth 3 (three) times daily. Tylenol 500 mg   2 tabs po TID     apixaban 5 MG Tabs tablet  Commonly known as:  ELIQUIS  Take 1 tablet (5 mg total) by mouth 2 (two) times daily.     benzonatate 100 MG capsule  Commonly known as:  TESSALON  Take 100 mg by mouth 3 (three) times daily.     co-enzyme Q-10 30 MG capsule  Take 30 mg by mouth daily.     flecainide 100 MG tablet  Commonly known as:  TAMBOCOR  Take 1 tablet (100 mg total) by mouth every 12 (twelve) hours.     lidocaine 5 %  Commonly known as:  LIDODERM  Place 1 patch onto the skin daily. Remove & Discard patch within 12 hours or as directed by MD     metoprolol tartrate 25 MG tablet  Commonly known as:  LOPRESSOR  Take 0.5 tablets (12.5 mg total) by mouth 2 (two) times daily.     risperiDONE 0.25 MG tablet  Commonly known as:  RISPERDAL  Take 0.25 mg by mouth 2 (two) times daily.     saccharomyces boulardii 250 MG capsule  Commonly known as:  FLORASTOR  Take 250 mg by mouth daily.     vitamin B-12 1000 MCG tablet  Commonly known as:  CYANOCOBALAMIN  Take 1,000 mcg by mouth daily.     vitamin C 500 MG tablet  Commonly known as:  ASCORBIC ACID  Take 1,000 mg by mouth daily.        Disposition: Return to her SNF Discharge  Instructions    Diet - low sodium heart healthy    Complete by:  As directed      Increase activity slowly    Complete by:  As directed           Follow-up Information    Follow up with [facility] On 10/14/2014.   Why:  at [facility] information:   [address] [place] [phone]      Follow up with Will Jorja Loa, MD On 12/21/2014.   Specialty:  Cardiology   Why:  at 9:30AM   Contact information:   [address] [place] [address] [phone]       Duration of Discharge Encounter: Greater than 30 minutes including physician time.  SignedFrancis Dowse, PA-C  10/01/2014 11:54 AM  I have seen and examined this patient with Francis Dowse.  Agree with above, note added to reflect my findings.  On exam, regular rhythm, no murmurs, no wheezing.  No further tachycardia on flecainide.  Plan for discharge on flecainide, apixiban, and metoprolol.      Will M. Camnitz MD 10/01/2014 3:13 PM

## 2014-10-01 NOTE — Progress Notes (Signed)
Pt dc to [facility] via [facility], attempt to call in report to the rehab facility, no one answered the phone, will call back again.

## 2014-10-05 ENCOUNTER — Non-Acute Institutional Stay (SKILLED_NURSING_FACILITY): Payer: Medicare Other | Admitting: Internal Medicine

## 2014-10-05 DIAGNOSIS — N179 Acute kidney failure, unspecified: Secondary | ICD-10-CM | POA: Diagnosis not present

## 2014-10-05 DIAGNOSIS — R079 Chest pain, unspecified: Secondary | ICD-10-CM | POA: Diagnosis not present

## 2014-10-05 DIAGNOSIS — I10 Essential (primary) hypertension: Secondary | ICD-10-CM

## 2014-10-05 DIAGNOSIS — Z933 Colostomy status: Secondary | ICD-10-CM | POA: Diagnosis not present

## 2014-10-05 DIAGNOSIS — E538 Deficiency of other specified B group vitamins: Secondary | ICD-10-CM

## 2014-10-05 DIAGNOSIS — J90 Pleural effusion, not elsewhere classified: Secondary | ICD-10-CM | POA: Diagnosis not present

## 2014-10-05 DIAGNOSIS — I480 Paroxysmal atrial fibrillation: Secondary | ICD-10-CM | POA: Diagnosis not present

## 2014-10-05 DIAGNOSIS — F0390 Unspecified dementia without behavioral disturbance: Secondary | ICD-10-CM | POA: Diagnosis not present

## 2014-10-05 DIAGNOSIS — S2231XD Fracture of one rib, right side, subsequent encounter for fracture with routine healing: Secondary | ICD-10-CM

## 2014-10-06 ENCOUNTER — Encounter: Payer: Self-pay | Admitting: Internal Medicine

## 2014-10-06 DIAGNOSIS — I10 Essential (primary) hypertension: Secondary | ICD-10-CM | POA: Insufficient documentation

## 2014-10-06 DIAGNOSIS — E538 Deficiency of other specified B group vitamins: Secondary | ICD-10-CM | POA: Insufficient documentation

## 2014-10-06 DIAGNOSIS — F0390 Unspecified dementia without behavioral disturbance: Secondary | ICD-10-CM | POA: Insufficient documentation

## 2014-10-06 DIAGNOSIS — R079 Chest pain, unspecified: Secondary | ICD-10-CM | POA: Insufficient documentation

## 2014-10-06 NOTE — Assessment & Plan Note (Signed)
Pleural effusion  No SOB, felt secondary to RVR with her AFib, SNF - cont monitor

## 2014-10-06 NOTE — Assessment & Plan Note (Signed)
SNF - improved from prior;BUN 25/Cr 0.53, GFR > 60; will monitor

## 2014-10-06 NOTE — Assessment & Plan Note (Signed)
SNF - cont supp 1000u daily 

## 2014-10-06 NOTE — Assessment & Plan Note (Signed)
SNF - no problems

## 2014-10-06 NOTE — Progress Notes (Signed)
MRN: [id number] Name: [name]  Sex: female Age: 81 y.o. DOB: [birth year]  [facility] #: [facility] Facility/Room:114 Level Of Care: SNF Provider: Merrilee Seashore D Emergency Contacts: Extended Emergency Contact Information Primary Emergency Contact: Smith,Peggy Address: [address]          [place] [address] [place] Home Phone: [phone] Relation: Friend Secondary Emergency Contact: Layne Benton [place] Home Phone: [phone] Relation: Friend  Code Status:   Allergies: Erythromycin; Penicillins; and Vicodin  Chief Complaint  Patient presents with  . New Admit To SNF    HPI: Patient is 81 y.o. female with large ventral hernia status post colostomy bag placement recently hospitalized for BLE cellulitis, then again for fall with rib fractures, resident at a SNF, she was noted to have fast, irregular heart beat by staff and rescue was called she was found to be in rapid AF, here in the hospital she was also observed to have an SVT. Pt was admitted from 9/26-30  where her a fib was treated medically per cardilology, CP w/u neg . Pt is admitted back to SNF to continue OT/PT for her fall with rib fx. While at SNF pt will be followed for  HTN, tx with lopressor, B12 def, tx with supplement and dementia tx with risperdal.   Past Medical History  Diagnosis Date  . S/P colostomy (HCC) 2008  . Shingles   . Ventral hernia     a. s/p colostomy bag.  . Normocytic anemia   . Fall     a. 08/2014 c/b rib fractures.  . Cellulitis 08/2014  . Prerenal azotemia 08/2014    Past Surgical History  Procedure Laterality Date  . Colostomy    . Total abdominal hysterectomy        Medication List       This list is accurate as of: 10/05/14 11:59 PM.  Always use your most recent med list.               acetaminophen 500 MG tablet  Commonly known as:  TYLENOL  Take 1,000 mg by mouth 3 (three) times daily. Tylenol 500 mg   2 tabs po  TID     apixaban 5 MG Tabs tablet  Commonly known as:  ELIQUIS  Take 1 tablet (5 mg total) by mouth 2 (two) times daily.     benzonatate 100 MG capsule  Commonly known as:  TESSALON  Take 100 mg by mouth 3 (three) times daily.     co-enzyme Q-10 30 MG capsule  Take 30 mg by mouth daily.     flecainide 100 MG tablet  Commonly known as:  TAMBOCOR  Take 1 tablet (100 mg total) by mouth every 12 (twelve) hours.     lidocaine 5 %  Commonly known as:  LIDODERM  Place 1 patch onto the skin daily. Remove & Discard patch within 12 hours or as directed by MD     metoprolol tartrate 25 MG tablet  Commonly known as:  LOPRESSOR  Take 0.5 tablets (12.5 mg total) by mouth 2 (two) times daily.     risperiDONE 0.25 MG tablet  Commonly known as:  RISPERDAL  Take 1 tablet (0.25 mg total) by mouth 2 (two) times daily.     saccharomyces boulardii 250 MG capsule  Commonly known as:  FLORASTOR  Take 250 mg by mouth daily.     vitamin B-12 1000 MCG tablet  Commonly known as:  CYANOCOBALAMIN  Take 1,000 mcg by mouth daily.     vitamin C 500 MG tablet  Commonly known as:  ASCORBIC ACID  Take 1,000 mg by mouth daily.        No orders of the defined types were placed in this encounter.     There is no immunization history on file for this patient.  Social History  Substance Use Topics  . Smoking status: Never Smoker   . Smokeless tobacco: Not on file  . Alcohol Use: No    Family history is + HD, DM2   Review of Systems  DATA OBTAINED: from patient, nurse GENERAL:  no fevers, fatigue, appetite changes SKIN: No itching, rash or wounds EYES: No eye pain, redness, discharge EARS: No earache, tinnitus, change in hearing NOSE: No congestion, drainage or bleeding  MOUTH/THROAT: No mouth or tooth pain, No sore throat RESPIRATORY: No cough, wheezing, SOB CARDIAC: No chest pain, palpitations, lower extremity edema  GI: No abdominal pain, No N/V/D or constipation, No heartburn or  reflux  GU: No dysuria, frequency or urgency, or incontinence  MUSCULOSKELETAL: No unrelieved bone/joint pain NEUROLOGIC: No headache, dizziness or focal weakness PSYCHIATRIC: No c/o anxiety or sadness   Filed Vitals:   10/06/14 0848  BP: 149/66  Pulse: 67  Temp: 97.2 F (36.2 C)  Resp: 18    SpO2 Readings from Last 1 Encounters:  10/01/14 96%        Physical Exam  GENERAL APPEARANCE: Alert, conversant,  No acute distress.  SKIN: No diaphoresis rash HEAD: Normocephalic, atraumatic  EYES: Conjunctiva/lids clear. Pupils round, reactive. EOMs intact.  EARS: External exam WNL, canals clear. Hearing grossly normal.  NOSE: No deformity or discharge.  MOUTH/THROAT: Lips w/o lesions  RESPIRATORY: Breathing is even, unlabored. Lung sounds are clear   CARDIOVASCULAR: Heart RRR no murmurs, rubs or gallops. No peripheral edema.   GASTROINTESTINAL: Abdomen is soft, non-tender, not distended w/ normal bowel sounds; colostomy. GENITOURINARY: Bladder non tender, not distended  MUSCULOSKELETAL: No abnormal joints or musculature NEUROLOGIC:  Cranial nerves 2-12 grossly intact. Moves all extremities  PSYCHIATRIC: Mood and affect appropriate to situation with dementia, no behavioral issues  Patient Active Problem List   Diagnosis Date Noted  . Chest pain 10/06/2014  . HTN (hypertension) 10/06/2014  . Vitamin B12 deficiency 10/06/2014  . Dementia without behavioral disturbance 10/06/2014  . Atrial fibrillation (HCC) 09/27/2014  . Pleural effusion 09/27/2014  . Edema 08/19/2014  . Pain, generalized 08/19/2014  . Oxygen desaturation 08/19/2014  . Leukopenia 08/09/2014  . Normocytic anemia 08/09/2014  . Cellulitis 08/06/2014  . Cellulitis of both lower extremities 08/06/2014  . Prerenal acute renal failure (HCC) 08/06/2014  . Rib fracture 08/06/2014  . Rib fractures   . Ventral hernia 07/28/2010  . S/P colostomy (HCC) 07/28/2010  . Shingles 07/28/2010    CBC    Component Value  Date/Time   WBC 2.4* 09/30/2014 0232   RBC 3.52* 09/30/2014 0232   HGB 10.7* 09/30/2014 0232   HCT 34.5* 09/30/2014 0232   PLT 200 09/30/2014 0232   MCV 98.0 09/30/2014 0232   LYMPHSABS 0.9 09/27/2014 1815   MONOABS 0.8 09/27/2014 1815   EOSABS 0.2 09/27/2014 1815   BASOSABS 0.0 09/27/2014 1815    CMP     Component Value Date/Time   NA 141 09/28/2014 0542   K 4.3 09/28/2014 0542   CL 107 09/28/2014 0542   CO2 27 09/28/2014 0542   GLUCOSE 106* 09/28/2014 0542   BUN 25* 09/28/2014 0542  CREATININE 0.53 09/28/2014 0542   CALCIUM 8.6* 09/28/2014 0542   PROT 5.2* 09/28/2014 0542   ALBUMIN 2.3* 09/28/2014 0542   AST 20 09/28/2014 0542   ALT 10* 09/28/2014 0542   ALKPHOS 79 09/28/2014 0542   BILITOT 0.5 09/28/2014 0542   GFRNONAA >60 09/28/2014 0542   GFRAA >60 09/28/2014 0542    Lab Results  Component Value Date   HGBA1C 6.0* 09/28/2014     Dg Chest Portable 1 View  09/27/2014   CLINICAL DATA:  Atrial fibrillation. Transient chest pain. Chest pain for 1 day. RIGHT-sided rib pain. Fractured RIGHT ribs.  EXAM: PORTABLE CHEST 1 VIEW  COMPARISON:  08/06/2014 chest radiograph.  FINDINGS: Small to moderate RIGHT pleural effusion or hemothorax is present. Associated atelectasis. Superimposed consolidation cannot be excluded. There also appears to be a LEFT pleural effusion with atelectasis in the retrocardiac region. Monitoring leads project over the chest. Some interval healing of RIGHT-sided rib fractures. Cardiopericardial silhouette is probably within normal limits allowing for projection.  IMPRESSION: 1. Small to moderate RIGHT pleural effusion. Probable small LEFT pleural effusion. 2. RIGHT-greater-than-LEFT basilar atelectasis. Superimposed consolidation cannot be excluded. 3. Healing RIGHT-sided rib fractures.   Electronically Signed   By: Andreas Newport M.D.   On: 09/27/2014 17:00    Not all labs, radiology exams or other studies done during hospitalization come through on my  EPIC note; however they are reviewed by me.    Assessment and Plan  Atrial fibrillation with RVR, SVT, resolved; SNF - cont flecanide and lopressor started in hospital and eliquis started for prophylaxis  Chest pain abnormal Troponin, felt secondary to RVR with her AF  Resolved with negative myoview SNF - monitor for new sx  Pleural effusion Pleural effusion  No SOB, felt secondary to RVR with her AFib, SNF - cont monitor    HTN (hypertension) SNF - borderline on lopressor 12.5 mg BID;plan - will monitor  Rib fractures SNF - cont supportive care  Prerenal acute renal failure SNF - improved from prior;BUN 25/Cr 0.53, GFR > 60; will monitor  S/P colostomy SNF - no problems  Vitamin B12 deficiency SNF - cont supp 1000 u daily  Dementia without behavioral disturbance SNF - supported with risperdal BID   Time spent > 45 min;> 50% of time with patient was spent reviewing records, labs, tests and studies, counseling and developing plan of care  Margit Hanks, MD

## 2014-10-06 NOTE — Assessment & Plan Note (Signed)
SNF - borderline on lopressor 12.5 mg BID;plan - will monitor

## 2014-10-06 NOTE — Assessment & Plan Note (Signed)
SNF - cont supportive care

## 2014-10-06 NOTE — Assessment & Plan Note (Signed)
abnormal Troponin, felt secondary to RVR with her AF  Resolved with negative myoview SNF - monitor for new sx

## 2014-10-06 NOTE — Assessment & Plan Note (Signed)
SNF - supported with risperdal BID

## 2014-10-06 NOTE — Assessment & Plan Note (Signed)
with RVR, SVT, resolved; SNF - cont flecanide and lopressor started in hospital and eliquis started for prophylaxis

## 2014-10-14 ENCOUNTER — Encounter (HOSPITAL_COMMUNITY): Payer: Self-pay | Admitting: Nurse Practitioner

## 2014-10-14 ENCOUNTER — Ambulatory Visit (HOSPITAL_COMMUNITY)
Admit: 2014-10-14 | Discharge: 2014-10-14 | Disposition: A | Discharge status: Home / Self Care | Payer: Medicare Other | Source: Ambulatory Visit | Attending: Nurse Practitioner | Admitting: Nurse Practitioner

## 2014-10-14 VITALS — BP 108/52 | HR 58 | Ht <= 58 in | Wt 160.0 lb

## 2014-10-14 DIAGNOSIS — I480 Paroxysmal atrial fibrillation: Secondary | ICD-10-CM | POA: Insufficient documentation

## 2014-10-14 NOTE — Progress Notes (Signed)
Patient ID: [name], female   DOB: 08-08-1928, 81 y.o.   MRN: [id number]     Primary Care Physician: Paulino RilyJONES,ENRICO G, MD Referring Physician: Sharene SkeansBryan Hager,PA Electrophysiologist: Dr. Melany Guernseyamnitz   [name] is a 81 y.o. female with  that presented [facility]/14 with complaints of CP, SOB. She is a resident at a SNF, she was noted to have fast, irregular heart beat by staff and rescue was called she was found to be in rapid AF, here in the hospital she was also observed to have an SVT. Cardiology service was called and she was started on amiodarone and heparin gtt. She had elevated Troponin and pleural effusion on her CXR both felt to be secondary to her rapid AFib without ongoing c/o CP or SOB in SR. Electrophysiology consult was requested for rate/rhythm control options and management. She underwent Lexiscan stress myoview that was negative for any ischemia, and was changed from amiodarone to Flecainide, and her heparing gtt changed to Eliquis. She has been feeling well, she has not had any recurrent AFib or SVT over the last 24hours,  BP appeared stable. The pt felt well, and is asked to go home. She has not had any further CP, no palpitations and denies any rest SOB, she states she slept well last nigth denies any orthopnea or PND. She worked with PT yesterday and states she felt well without SOB or CP. The patient had a  fall with trauma, in August, though denies falls as a recurrent problem, states she has been working with therapy at the SNF and her stamina and strength continues to improve. The patient as seen by Dr. Elberta Fortisamnitz who felt she was stable to discharge back to her SNF.  She is being seen in the afib clinic as f/u for recent hospitalization. She is in a w/c, but states she is walking several times a day at her facility and feels well. She has not noticed any irregular heart beat or shortness of breath. Continues on flecainide 100 mg bid and eliquis. She denies  bleeding issues. BP soft around 100 systolic abut does not appear to be symptomatic with this.   Today, she denies symptoms of palpitations, chest pain, shortness of breath, orthopnea, PND, lower extremity edema, dizziness, presyncope, syncope, or neurologic sequela. The patient is tolerating medications without difficulties and is otherwise without complaint today.   Past Medical History  Diagnosis Date  . S/P colostomy (HCC) 2008  . Shingles   . Ventral hernia     a. s/p colostomy bag.  . Normocytic anemia   . Fall     a. 08/2014 c/b rib fractures.  . Cellulitis 08/2014  . Prerenal azotemia 08/2014   Past Surgical History  Procedure Laterality Date  . Colostomy    . Total abdominal hysterectomy      Current Outpatient Prescriptions  Medication Sig Dispense Refill  . acetaminophen (TYLENOL) 500 MG tablet Take 1,000 mg by mouth 3 (three) times daily. Tylenol 500 mg   2 tabs po TID    . apixaban (ELIQUIS) 5 MG TABS tablet Take 1 tablet (5 mg total) by mouth 2 (two) times daily. 60 tablet 3  . benzonatate (TESSALON) 100 MG capsule Take 100 mg by mouth 3 (three) times daily.    Marland Kitchen. co-enzyme Q-10 30 MG capsule Take 30 mg by mouth daily.      . flecainide (TAMBOCOR) 100 MG tablet Take 1 tablet (100 mg total) by mouth every 12 (twelve) hours. 60  tablet 3  . lidocaine (LIDODERM) 5 % Place 1 patch onto the skin daily. Remove & Discard patch within 12 hours or as directed by MD 30 patch 0  . metoprolol tartrate (LOPRESSOR) 25 MG tablet Take 0.5 tablets (12.5 mg total) by mouth 2 (two) times daily. 30 tablet 3  . risperiDONE (RISPERDAL) 0.25 MG tablet Take 1 tablet (0.25 mg total) by mouth 2 (two) times daily. 60 tablet 0  . saccharomyces boulardii (FLORASTOR) 250 MG capsule Take 250 mg by mouth daily.    . vitamin B-12 (CYANOCOBALAMIN) 1000 MCG tablet Take 1,000 mcg by mouth daily.    . vitamin C (ASCORBIC ACID) 500 MG tablet Take 1,000 mg by mouth daily.     No current facility-administered  medications for this encounter.    Allergies  Allergen Reactions  . Erythromycin Nausea And Vomiting  . Penicillins Other (See Comments)    Per MAR -   . Vicodin [Hydrocodone-Acetaminophen]     Patient reports history of significant hallucinations and agitation when taking narcotics in the past     Social History   Social History  . Marital Status: Single    Spouse Name: N/A  . Number of Children: N/A  . Years of Education: N/A   Occupational History  . Not on file.   Social History Main Topics  . Smoking status: Never Smoker   . Smokeless tobacco: Not on file  . Alcohol Use: No  . Drug Use: No  . Sexual Activity: Not on file   Other Topics Concern  . Not on file   Social History Narrative    Family History  Problem Relation Age of Onset  . Heart failure Mother     Took Lasix  . Heart disease Father     Patient thinks father had some sort of heart disease but not sure what kind. No CAD in siblings.  . Prostate cancer Brother   . Diabetes Brother     ROS- All systems are reviewed and negative except as per the HPI above  Physical Exam: Filed Vitals:   10/14/14 0924  BP: 108/52  Pulse: 58  Height:  (1.448 m)  Weight: 160 lb (72.576 kg)    GEN- The patient is well appearing, alert and oriented x 3 today.   Head- normocephalic, atraumatic Eyes-  Sclera clear, conjunctiva pink Ears- hearing intact Oropharynx- clear Neck- supple, no JVP Lymph- no cervical lymphadenopathy Lungs- Clear to ausculation bilaterally, normal work of breathing Heart- Regular rate and rhythm, no murmurs, rubs or gallops, PMI not laterally displaced GI- soft, NT, ND, + BS Extremities- no clubbing, cyanosis, or edema MS- no significant deformity or atrophy Skin- no rash or lesion Psych- euthymic mood, full affect Neuro- strength and sensation are intact  EKG- S brady at 56 bpm, pr int `64 ms, QRS int 102 ms, Qtc 397 ms. Epic records reviewed, including discharge  summary.  Assessment and Plan: 1. PAF In SR on flecainide, continue 100 mg bid/ metoprolol 12.5 mg bid Flecainide level Continue apixaban, no recent falls, fall precaution discussed  F/u in afib clinic in one month and in December as scheduled with Dr. Clint Lipps C. Matthew Folks [facility] [address] [place], [place] [address] [phone]

## 2014-10-15 LAB — FLECAINIDE LEVEL: FLECAINIDE: 0.61 ug/mL (ref 0.20–1.00)

## 2014-11-10 ENCOUNTER — Non-Acute Institutional Stay (SKILLED_NURSING_FACILITY): Payer: Medicare Other | Admitting: Internal Medicine

## 2014-11-10 ENCOUNTER — Encounter: Payer: Self-pay | Admitting: Internal Medicine

## 2014-11-10 DIAGNOSIS — R600 Localized edema: Secondary | ICD-10-CM | POA: Diagnosis not present

## 2014-11-10 NOTE — Assessment & Plan Note (Signed)
And a reported weight gain of 4 pounds. I have combed her records, she has not been on lasix prior. In fact she has been tx for dehydration several times. Her EF in 09/2014 was 60-65%. Pt does not elevate her legs, pt is on no meds to cause swelling. Will start with TED hose and encourage elevation of feet.. If pt continues to gain weight felt to be fluid overload will consider lasix at low dose and follow rena=l Fx and hyponatremia, which pt has had prior.

## 2014-11-10 NOTE — Progress Notes (Signed)
MRN: [id number] Name: [name]  Sex: female Age: 81 y.o. DOB: Nov 13, 1928  [id number] Facility/Room: Level Of Care: SNF Provider: Merrilee SeashoreALEXANDER, ANNE D Emergency Contacts: Extended Emergency Contact Information Primary Emergency Contact: Smith,Peggy [address]          [place] [id number] [place] Home Phone: [phone] Relation: Friend Secondary Emergency Contact: Layne Bentonais,Rose Marie  [place] Home Phone: [phone] Relation: Friend  Code Status:   Allergies: Erythromycin; Penicillins; and Vicodin  Chief Complaint  Patient presents with  . Acute Visit    HPI: Patient is 81 y.o. female with anemia, colostomy and recent cellulitis is being seen today acutely for weight gain of 4 pounds and LE edema. No SOB, no fever coughs or other systemic sx.  Past Medical History  Diagnosis Date  . S/P colostomy (HCC) 2008  . Shingles   . Ventral hernia     a. s/p colostomy bag.  . Normocytic anemia   . Fall     a. 08/2014 c/b rib fractures.  . Cellulitis 08/2014  . Prerenal azotemia 08/2014    Past Surgical History  Procedure Laterality Date  . Colostomy    . Total abdominal hysterectomy        Medication List       This list is accurate as of: 11/10/14  1:34 PM.  Always use your most recent med list.               acetaminophen 500 MG tablet  Commonly known as:  TYLENOL  Take 1,000 mg by mouth 3 (three) times daily. Tylenol 500 mg   2 tabs po TID     apixaban 5 MG Tabs tablet  Commonly known as:  ELIQUIS  Take 1 tablet (5 mg total) by mouth 2 (two) times daily.     benzonatate 100 MG capsule  Commonly known as:  TESSALON  Take 100 mg by mouth 3 (three) times daily.     co-enzyme Q-10 30 MG capsule  Take 30 mg by mouth daily.     flecainide 100 MG tablet  Commonly known as:  TAMBOCOR  Take 1 tablet (100 mg total) by mouth every 12 (twelve) hours.     lidocaine 5 %  Commonly known as:   LIDODERM  Place 1 patch onto the skin daily. Remove & Discard patch within 12 hours or as directed by MD     metoprolol tartrate 25 MG tablet  Commonly known as:  LOPRESSOR  Take 0.5 tablets (12.5 mg total) by mouth 2 (two) times daily.     risperiDONE 0.25 MG tablet  Commonly known as:  RISPERDAL  Take 1 tablet (0.25 mg total) by mouth 2 (two) times daily.     saccharomyces boulardii 250 MG capsule  Commonly known as:  FLORASTOR  Take 250 mg by mouth daily.     vitamin B-12 1000 MCG tablet  Commonly known as:  CYANOCOBALAMIN  Take 1,000 mcg by mouth daily.     vitamin C 500 MG tablet  Commonly known as:  ASCORBIC ACID  Take 1,000 mg by mouth daily.        No orders of the defined types were placed in this encounter.     There is no immunization history on file for this patient.  Social History  Substance Use Topics  . Smoking status: Never Smoker   . Smokeless tobacco: Not on file  . Alcohol Use: No  Review of Systems  DATA OBTAINED: from patient, nurse GENERAL:  no fevers, fatigue, appetite changes SKIN: No itching, rash HEENT: No complaint RESPIRATORY: No cough, wheezing, SOB CARDIAC: No chest pain, palpitations, lower extremity edema  GI: No abdominal pain, No N/V/D or constipation, No heartburn or reflux  GU: No dysuria, frequency or urgency, or incontinence  MUSCULOSKELETAL: No unrelieved bone/joint pain NEUROLOGIC: No headache, dizziness  PSYCHIATRIC: No overt anxiety or sadness  Filed Vitals:   11/10/14 1317  BP: 130/69  Pulse: 66  Temp: 98 F (36.7 C)  Resp: 17    Physical Exam  GENERAL APPEARANCE: Alert, conversant, No acute distress  SKIN: No diaphoresis rash, or wounds HEENT: Unremarkable RESPIRATORY: Breathing is even, unlabored. Lung sounds are clear   CARDIOVASCULAR: Heart RRR no murmurs, rubs or gallops. 1= peripheral edema  GASTROINTESTINAL: Abdomen is soft, non-tender, not distended w/ normal bowel sounds.  GENITOURINARY:  Bladder non tender, not distended  MUSCULOSKELETAL: No abnormal joints or musculature NEUROLOGIC: Cranial nerves 2-12 grossly intact. Moves all extremities PSYCHIATRIC: Mood and affect appropriate to situation, no behavioral issues  Patient Active Problem List   Diagnosis Date Noted  . Bilateral lower extremity edema 11/10/2014  . Chest pain 10/06/2014  . HTN (hypertension) 10/06/2014  . Vitamin B12 deficiency 10/06/2014  . Dementia without behavioral disturbance 10/06/2014  . Atrial fibrillation (HCC) 09/27/2014  . Pleural effusion 09/27/2014  . Edema 08/19/2014  . Pain, generalized 08/19/2014  . Oxygen desaturation 08/19/2014  . Leukopenia 08/09/2014  . Normocytic anemia 08/09/2014  . Cellulitis 08/06/2014  . Cellulitis of both lower extremities 08/06/2014  . Prerenal acute renal failure (HCC) 08/06/2014  . Rib fracture 08/06/2014  . Rib fractures   . Ventral hernia 07/28/2010  . S/P colostomy (HCC) 07/28/2010  . Shingles 07/28/2010    CBC    Component Value Date/Time   WBC 2.4* 09/30/2014 0232   RBC 3.52* 09/30/2014 0232   HGB 10.7* 09/30/2014 0232   HCT 34.5* 09/30/2014 0232   PLT 200 09/30/2014 0232   MCV 98.0 09/30/2014 0232   LYMPHSABS 0.9 09/27/2014 1815   MONOABS 0.8 09/27/2014 1815   EOSABS 0.2 09/27/2014 1815   BASOSABS 0.0 09/27/2014 1815    CMP     Component Value Date/Time   NA 141 09/28/2014 0542   K 4.3 09/28/2014 0542   CL 107 09/28/2014 0542   CO2 27 09/28/2014 0542   GLUCOSE 106* 09/28/2014 0542   BUN 25* 09/28/2014 0542   CREATININE 0.53 09/28/2014 0542   CALCIUM 8.6* 09/28/2014 0542   PROT 5.2* 09/28/2014 0542   ALBUMIN 2.3* 09/28/2014 0542   AST 20 09/28/2014 0542   ALT 10* 09/28/2014 0542   ALKPHOS 79 09/28/2014 0542   BILITOT 0.5 09/28/2014 0542   GFRNONAA >60 09/28/2014 0542   GFRAA >60 09/28/2014 0542    Assessment and Plan  Bilateral lower extremity edema And a reported weight gain of 4 pounds. I have combed her records,  she has not been on lasix prior. In fact she has been tx for dehydration several times. Her EF in 09/2014 was 60-65%. Pt does not elevate her legs, pt is on no meds to cause swelling. Will start with TED hose and encourage elevation of feet.. If pt continues to gain weight felt to be fluid overload will consider lasix at low dose and follow rena=l Fx and hyponatremia, which pt has had prior.   Time spent 35 min;> 50% of time with patient was spent reviewing records, labs, tests  and studies, counseling and developing plan of care  Hennie Duos, MD

## 2014-11-16 ENCOUNTER — Ambulatory Visit (HOSPITAL_COMMUNITY)
Admission: RE | Admit: 2014-11-16 | Discharge: 2014-11-16 | Disposition: A | Discharge status: Home / Self Care | Payer: Medicare Other | Source: Ambulatory Visit | Attending: Nurse Practitioner | Admitting: Nurse Practitioner

## 2014-11-16 ENCOUNTER — Encounter (HOSPITAL_COMMUNITY): Payer: Self-pay | Admitting: Nurse Practitioner

## 2014-11-16 VITALS — BP 126/52 | HR 53 | Ht 60.0 in | Wt 172.6 lb

## 2014-11-16 DIAGNOSIS — I480 Paroxysmal atrial fibrillation: Secondary | ICD-10-CM | POA: Diagnosis present

## 2014-11-16 NOTE — Progress Notes (Signed)
Patient ID: [name], female   DOB: 1928-09-08, 81 y.o.   MRN: [id number]       Primary Care Physician: Paulino Rily, MD Referring Physician: Sharene Skeans Electrophysiologist: Dr. Melany Guernsey is a 81 y.o. female with  that presented to [facility] 09/27/14 with complaints of CP, SOB. She is a resident at a SNF, she was noted to have fast, irregular heart beat by staff and rescue was called she was found to be in rapid AF, here in the hospital she was also observed to have an SVT. Cardiology service was called and she was started on amiodarone and heparin gtt. She had elevated Troponin and pleural effusion on her CXR both felt to be secondary to her rapid AFib without ongoing c/o CP or SOB in SR. Electrophysiology consult was requested for rate/rhythm control options and management. She underwent Lexiscan stress myoview that was negative for any ischemia, and was changed from amiodarone to Flecainide, and her heparing gtt changed to Eliquis. She has been feeling well, she has not had any recurrent AFib or SVT over the last 24hours,  BP appeared stable. The pt felt well, and is asked to go home. She has not had any further CP, no palpitations and denies any rest SOB, she states she slept well last nigth denies any orthopnea or PND. She worked with PT yesterday and states she felt well without SOB or CP. The patient had a  fall with trauma, in August, though denies falls as a recurrent problem, states she has been working with therapy at the SNF and her stamina and strength continues to improve. The patient as seen by Dr. Elberta Fortis who felt she was stable to discharge back to her SNF.  She was  seen in the afib clinic, 10/13, as f/u for recent hospitalization. She is in a w/c, but states she is walking several times a day at her facility and feels well. She has not noticed any irregular heart beat or shortness of breath. Continues on flecainide 100 mg bid and eliquis. She denies  bleeding issues.   On f/u today and reports continuing to do well without any issues with afib or irregular heart beat.  She has been told that  she has been talking in her sleep and she wonders if he might be any of the drugs. Otherwise feels well.  Today, she denies symptoms of palpitations, chest pain, shortness of breath, orthopnea, PND, lower extremity edema, dizziness, presyncope, syncope, or neurologic sequela. The patient is tolerating medications without difficulties and is otherwise without complaint today.   Past Medical History  Diagnosis Date  . S/P colostomy (HCC) 2008  . Shingles   . Ventral hernia     a. s/p colostomy bag.  . Normocytic anemia   . Fall     a. 08/2014 c/b rib fractures.  . Cellulitis 08/2014  . Prerenal azotemia 08/2014   Past Surgical History  Procedure Laterality Date  . Colostomy    . Total abdominal hysterectomy      Current Outpatient Prescriptions  Medication Sig Dispense Refill  . acetaminophen (TYLENOL) 500 MG tablet Take 1,000 mg by mouth 3 (three) times daily. Tylenol 500 mg   2 tabs po TID    . apixaban (ELIQUIS) 5 MG TABS tablet Take 1 tablet (5 mg total) by mouth 2 (two) times daily. 60 tablet 3  . benzonatate (TESSALON) 100 MG capsule Take 100 mg by mouth 3 (three) times daily.    Marland Kitchen  co-enzyme Q-10 30 MG capsule Take 30 mg by mouth daily.      . flecainide (TAMBOCOR) 100 MG tablet Take 1 tablet (100 mg total) by mouth every 12 (twelve) hours. 60 tablet 3  . lidocaine (LIDODERM) 5 % Place 1 patch onto the skin daily. Remove & Discard patch within 12 hours or as directed by MD 30 patch 0  . metoprolol tartrate (LOPRESSOR) 25 MG tablet Take 0.5 tablets (12.5 mg total) by mouth 2 (two) times daily. 30 tablet 3  . risperiDONE (RISPERDAL) 0.25 MG tablet Take 1 tablet (0.25 mg total) by mouth 2 (two) times daily. 60 tablet 0  . saccharomyces boulardii (FLORASTOR) 250 MG capsule Take 250 mg by mouth daily.    . vitamin B-12 (CYANOCOBALAMIN) 1000  MCG tablet Take 1,000 mcg by mouth daily.    . vitamin C (ASCORBIC ACID) 500 MG tablet Take 1,000 mg by mouth daily.     No current facility-administered medications for this encounter.    Allergies  Allergen Reactions  . Erythromycin Nausea And Vomiting  . Penicillins Other (See Comments)    Per MAR -   . Vicodin [Hydrocodone-Acetaminophen]     Patient reports history of significant hallucinations and agitation when taking narcotics in the past     Social History   Social History  . Marital Status: Single    Spouse Name: N/A  . Number of Children: N/A  . Years of Education: N/A   Occupational History  . Not on file.   Social History Main Topics  . Smoking status: Never Smoker   . Smokeless tobacco: Not on file  . Alcohol Use: No  . Drug Use: No  . Sexual Activity: Not on file   Other Topics Concern  . Not on file   Social History Narrative    Family History  Problem Relation Age of Onset  . Heart failure Mother     Took Lasix  . Heart disease Father     Patient thinks father had some sort of heart disease but not sure what kind. No CAD in siblings.  . Prostate cancer Brother   . Diabetes Brother     ROS- All systems are reviewed and negative except as per the HPI above  Physical Exam: Filed Vitals:   11/16/14 1029  BP: 126/52  Pulse: 53  Height: 5' (1.524 m)  Weight: 172 lb 9.6 oz (78.291 kg)    GEN- The patient is well appearing, alert and oriented x 3 today.   Head- normocephalic, atraumatic Eyes-  Sclera clear, conjunctiva pink Ears- hearing intact Oropharynx- clear Neck- supple, no JVP Lymph- no cervical lymphadenopathy Lungs- Clear to ausculation bilaterally, normal work of breathing Heart- Regular rate and rhythm, no murmurs, rubs or gallops, PMI not laterally displaced GI- soft, NT, ND, + BS Extremities- no clubbing, cyanosis, or edema MS- no significant deformity or atrophy Skin- no rash or lesion Psych- euthymic mood, full  affect Neuro- strength and sensation are intact  EKG- S brady at 53 bpm, pr int 166 ms, QRS int 92 ms, Qtc 397 ms. Epic records reviewed, including discharge summary.  Assessment and Plan: 1. PAF In SR on flecainide, continue 100 mg bid/ metoprolol 12.5 mg bid Continue apixaban, no recent falls, fall precaution discussed  F/u with Dr. Elberta Fortisamnitz at 53 bpm, pr int 166 ms, QRS 92 ms, qtc int 397 ms afib clinic as needed.  Elvina Sidleonna C. Matthew Folksarroll, ANP-C [facility] [address] [place],  [place] [address] [phone]

## 2014-11-28 ENCOUNTER — Encounter (HOSPITAL_COMMUNITY): Payer: Self-pay | Admitting: *Deleted

## 2014-11-28 ENCOUNTER — Inpatient Hospital Stay (HOSPITAL_COMMUNITY)
Admission: EM | Admit: 2014-11-28 | Discharge: 2014-12-02 | DRG: 871 | Disposition: E | Payer: Medicare Other | Attending: Internal Medicine | Admitting: Internal Medicine

## 2014-11-28 ENCOUNTER — Emergency Department (HOSPITAL_COMMUNITY): Payer: Medicare Other

## 2014-11-28 DIAGNOSIS — I5033 Acute on chronic diastolic (congestive) heart failure: Secondary | ICD-10-CM | POA: Diagnosis present

## 2014-11-28 DIAGNOSIS — R680 Hypothermia, not associated with low environmental temperature: Secondary | ICD-10-CM | POA: Diagnosis present

## 2014-11-28 DIAGNOSIS — J9692 Respiratory failure, unspecified with hypercapnia: Secondary | ICD-10-CM

## 2014-11-28 DIAGNOSIS — Z79899 Other long term (current) drug therapy: Secondary | ICD-10-CM

## 2014-11-28 DIAGNOSIS — E8809 Other disorders of plasma-protein metabolism, not elsewhere classified: Secondary | ICD-10-CM | POA: Diagnosis present

## 2014-11-28 DIAGNOSIS — R0602 Shortness of breath: Secondary | ICD-10-CM | POA: Diagnosis not present

## 2014-11-28 DIAGNOSIS — F0390 Unspecified dementia without behavioral disturbance: Secondary | ICD-10-CM | POA: Diagnosis present

## 2014-11-28 DIAGNOSIS — I509 Heart failure, unspecified: Secondary | ICD-10-CM | POA: Diagnosis present

## 2014-11-28 DIAGNOSIS — J9602 Acute respiratory failure with hypercapnia: Secondary | ICD-10-CM | POA: Diagnosis present

## 2014-11-28 DIAGNOSIS — Y95 Nosocomial condition: Secondary | ICD-10-CM | POA: Diagnosis present

## 2014-11-28 DIAGNOSIS — Z66 Do not resuscitate: Secondary | ICD-10-CM | POA: Diagnosis present

## 2014-11-28 DIAGNOSIS — D7589 Other specified diseases of blood and blood-forming organs: Secondary | ICD-10-CM | POA: Diagnosis present

## 2014-11-28 DIAGNOSIS — I5030 Unspecified diastolic (congestive) heart failure: Secondary | ICD-10-CM | POA: Diagnosis present

## 2014-11-28 DIAGNOSIS — J9691 Respiratory failure, unspecified with hypoxia: Secondary | ICD-10-CM | POA: Diagnosis present

## 2014-11-28 DIAGNOSIS — Z515 Encounter for palliative care: Secondary | ICD-10-CM | POA: Diagnosis present

## 2014-11-28 DIAGNOSIS — E872 Acidosis: Secondary | ICD-10-CM | POA: Diagnosis present

## 2014-11-28 DIAGNOSIS — Z7901 Long term (current) use of anticoagulants: Secondary | ICD-10-CM | POA: Diagnosis not present

## 2014-11-28 DIAGNOSIS — R609 Edema, unspecified: Secondary | ICD-10-CM | POA: Diagnosis present

## 2014-11-28 DIAGNOSIS — J90 Pleural effusion, not elsewhere classified: Secondary | ICD-10-CM | POA: Diagnosis present

## 2014-11-28 DIAGNOSIS — Z8249 Family history of ischemic heart disease and other diseases of the circulatory system: Secondary | ICD-10-CM

## 2014-11-28 DIAGNOSIS — Z809 Family history of malignant neoplasm, unspecified: Secondary | ICD-10-CM

## 2014-11-28 DIAGNOSIS — A419 Sepsis, unspecified organism: Secondary | ICD-10-CM | POA: Diagnosis not present

## 2014-11-28 DIAGNOSIS — J948 Other specified pleural conditions: Secondary | ICD-10-CM

## 2014-11-28 DIAGNOSIS — R600 Localized edema: Secondary | ICD-10-CM

## 2014-11-28 DIAGNOSIS — I482 Chronic atrial fibrillation: Secondary | ICD-10-CM

## 2014-11-28 DIAGNOSIS — J9601 Acute respiratory failure with hypoxia: Secondary | ICD-10-CM | POA: Diagnosis present

## 2014-11-28 DIAGNOSIS — J9600 Acute respiratory failure, unspecified whether with hypoxia or hypercapnia: Secondary | ICD-10-CM | POA: Insufficient documentation

## 2014-11-28 DIAGNOSIS — I4891 Unspecified atrial fibrillation: Secondary | ICD-10-CM | POA: Diagnosis present

## 2014-11-28 DIAGNOSIS — J189 Pneumonia, unspecified organism: Secondary | ICD-10-CM | POA: Diagnosis present

## 2014-11-28 DIAGNOSIS — I10 Essential (primary) hypertension: Secondary | ICD-10-CM | POA: Diagnosis present

## 2014-11-28 DIAGNOSIS — Z933 Colostomy status: Secondary | ICD-10-CM | POA: Diagnosis not present

## 2014-11-28 DIAGNOSIS — Z90710 Acquired absence of both cervix and uterus: Secondary | ICD-10-CM | POA: Diagnosis not present

## 2014-11-28 DIAGNOSIS — Z789 Other specified health status: Secondary | ICD-10-CM | POA: Diagnosis not present

## 2014-11-28 DIAGNOSIS — Z833 Family history of diabetes mellitus: Secondary | ICD-10-CM

## 2014-11-28 DIAGNOSIS — D72819 Decreased white blood cell count, unspecified: Secondary | ICD-10-CM | POA: Diagnosis present

## 2014-11-28 HISTORY — DX: Unspecified atrial fibrillation: I48.91

## 2014-11-28 HISTORY — DX: Unspecified lack of coordination: R27.9

## 2014-11-28 HISTORY — DX: Colostomy status: Z93.3

## 2014-11-28 HISTORY — DX: Muscle weakness (generalized): M62.81

## 2014-11-28 HISTORY — DX: Repeated falls: R29.6

## 2014-11-28 HISTORY — DX: Dysphagia, unspecified: R13.10

## 2014-11-28 HISTORY — DX: Essential (primary) hypertension: I10

## 2014-11-28 LAB — CBC WITH DIFFERENTIAL/PLATELET
BASOS ABS: 0 10*3/uL (ref 0.0–0.1)
Basophils Relative: 1 %
EOS ABS: 0 10*3/uL (ref 0.0–0.7)
Eosinophils Relative: 1 %
HCT: 36 % (ref 36.0–46.0)
HEMOGLOBIN: 10.4 g/dL — AB (ref 12.0–15.0)
LYMPHS PCT: 19 %
Lymphs Abs: 0.8 10*3/uL (ref 0.7–4.0)
MCH: 31.8 pg (ref 26.0–34.0)
MCHC: 28.9 g/dL — AB (ref 30.0–36.0)
MCV: 110.1 fL — ABNORMAL HIGH (ref 78.0–100.0)
MONO ABS: 0.6 10*3/uL (ref 0.1–1.0)
Monocytes Relative: 14 %
NEUTROS PCT: 65 %
Neutro Abs: 3 10*3/uL (ref 1.7–7.7)
PLATELETS: 157 10*3/uL (ref 150–400)
RBC: 3.27 MIL/uL — ABNORMAL LOW (ref 3.87–5.11)
RDW: 14.9 % (ref 11.5–15.5)
WBC: 4.4 10*3/uL (ref 4.0–10.5)

## 2014-11-28 LAB — COMPREHENSIVE METABOLIC PANEL
ALK PHOS: 75 U/L (ref 38–126)
ALT: 26 U/L (ref 14–54)
ANION GAP: 6 (ref 5–15)
AST: 23 U/L (ref 15–41)
Albumin: 3.4 g/dL — ABNORMAL LOW (ref 3.5–5.0)
BILIRUBIN TOTAL: 0.4 mg/dL (ref 0.3–1.2)
BUN: 35 mg/dL — ABNORMAL HIGH (ref 6–20)
CALCIUM: 9.3 mg/dL (ref 8.9–10.3)
CO2: 38 mmol/L — ABNORMAL HIGH (ref 22–32)
CREATININE: 0.73 mg/dL (ref 0.44–1.00)
Chloride: 101 mmol/L (ref 101–111)
Glucose, Bld: 139 mg/dL — ABNORMAL HIGH (ref 65–99)
Potassium: 4.6 mmol/L (ref 3.5–5.1)
SODIUM: 145 mmol/L (ref 135–145)
TOTAL PROTEIN: 6.5 g/dL (ref 6.5–8.1)

## 2014-11-28 LAB — BLOOD GAS, ARTERIAL
ACID-BASE EXCESS: 7.2 mmol/L — AB (ref 0.0–2.0)
Acid-Base Excess: 10.8 mmol/L — ABNORMAL HIGH (ref 0.0–2.0)
Bicarbonate: 34.1 mEq/L — ABNORMAL HIGH (ref 20.0–24.0)
Bicarbonate: 36.9 mEq/L — ABNORMAL HIGH (ref 20.0–24.0)
DRAWN BY: 308601
Drawn by: 308601
FIO2: 0.5
O2 CONTENT: 15 L/min
O2 Content: 4 L/min
O2 SAT: 95.3 %
O2 Saturation: 88.4 %
PATIENT TEMPERATURE: 98.6
PCO2 ART: 59.6 mmHg — AB (ref 35.0–45.0)
PH ART: 7.408 (ref 7.350–7.450)
PO2 ART: 77 mmHg — AB (ref 80.0–100.0)
PO2 ART: 52.8 mmHg — AB (ref 80.0–100.0)
Patient temperature: 98.6
TCO2: 32.1 mmol/L (ref 0–100)
TCO2: 34.3 mmol/L (ref 0–100)
pCO2 arterial: 65 mmHg (ref 35.0–45.0)
pH, Arterial: 7.339 — ABNORMAL LOW (ref 7.350–7.450)

## 2014-11-28 LAB — URINE MICROSCOPIC-ADD ON: RBC / HPF: NONE SEEN RBC/hpf (ref 0–5)

## 2014-11-28 LAB — STREP PNEUMONIAE URINARY ANTIGEN: Strep Pneumo Urinary Antigen: NEGATIVE

## 2014-11-28 LAB — URINALYSIS, ROUTINE W REFLEX MICROSCOPIC
Bilirubin Urine: NEGATIVE
Glucose, UA: NEGATIVE mg/dL
HGB URINE DIPSTICK: NEGATIVE
Ketones, ur: NEGATIVE mg/dL
NITRITE: NEGATIVE
Protein, ur: NEGATIVE mg/dL
SPECIFIC GRAVITY, URINE: 1.025 (ref 1.005–1.030)
pH: 5.5 (ref 5.0–8.0)

## 2014-11-28 LAB — TROPONIN I: Troponin I: <0.03 ng/mL (ref <0.031)

## 2014-11-28 LAB — MRSA PCR SCREENING: MRSA BY PCR: INVALID — AB

## 2014-11-28 LAB — BRAIN NATRIURETIC PEPTIDE: B NATRIURETIC PEPTIDE 5: 262.2 pg/mL — AB (ref 0.0–100.0)

## 2014-11-28 LAB — I-STAT CG4 LACTIC ACID, ED: Lactic Acid, Venous: 2.2 mmol/L (ref 0.5–2.0)

## 2014-11-28 LAB — D-DIMER, QUANTITATIVE: D-Dimer, Quant: 1.31 ug/mL-FEU — ABNORMAL HIGH (ref 0.00–0.50)

## 2014-11-28 MED ORDER — LEVALBUTEROL HCL 0.63 MG/3ML IN NEBU: 0.6300 mg | INHALATION_SOLUTION | RESPIRATORY_TRACT | Status: DC | PRN

## 2014-11-28 MED ORDER — VANCOMYCIN HCL IN DEXTROSE 1-5 GM/200ML-% IV SOLN
1000.0000 mg | Freq: Once | INTRAVENOUS | Status: AC
  Administered 2014-11-28: 1000 mg via INTRAVENOUS
  Filled 2014-11-28: qty 200

## 2014-11-28 MED ORDER — SACCHAROMYCES BOULARDII 250 MG PO CAPS
250.0000 mg | ORAL_CAPSULE | Freq: Every day | ORAL | Status: DC
  Administered 2014-11-28: 250 mg via ORAL
  Filled 2014-11-28 (×2): qty 1

## 2014-11-28 MED ORDER — RISPERIDONE 0.5 MG PO TABS
0.5000 mg | ORAL_TABLET | Freq: Every evening | ORAL | Status: DC
  Administered 2014-11-28: 0.5 mg via ORAL
  Filled 2014-11-28: qty 1

## 2014-11-28 MED ORDER — DEXTROSE 5 % IV SOLN
1.0000 g | Freq: Three times a day (TID) | INTRAVENOUS | Status: DC
  Administered 2014-11-28 – 2014-11-29 (×4): 1 g via INTRAVENOUS
  Filled 2014-11-28 (×6): qty 1

## 2014-11-28 MED ORDER — VITAMIN B-12 1000 MCG PO TABS
1000.0000 ug | ORAL_TABLET | Freq: Every day | ORAL | Status: DC
  Administered 2014-11-28: 1000 ug via ORAL
  Filled 2014-11-28 (×2): qty 1

## 2014-11-28 MED ORDER — VITAMIN C 500 MG PO TABS
1000.0000 mg | ORAL_TABLET | Freq: Every day | ORAL | Status: DC
  Administered 2014-11-28: 1000 mg via ORAL
  Filled 2014-11-28 (×2): qty 2

## 2014-11-28 MED ORDER — FUROSEMIDE 10 MG/ML IJ SOLN
40.0000 mg | Freq: Once | INTRAMUSCULAR | Status: DC
  Filled 2014-11-28: qty 4

## 2014-11-28 MED ORDER — APIXABAN 5 MG PO TABS
5.0000 mg | ORAL_TABLET | Freq: Two times a day (BID) | ORAL | Status: DC
Start: 2014-11-28 — End: 2014-11-28

## 2014-11-28 MED ORDER — VANCOMYCIN HCL IN DEXTROSE 750-5 MG/150ML-% IV SOLN
750.0000 mg | Freq: Two times a day (BID) | INTRAVENOUS | Status: DC
  Administered 2014-11-28 – 2014-11-29 (×2): 750 mg via INTRAVENOUS
  Filled 2014-11-28 (×3): qty 150

## 2014-11-28 MED ORDER — LORAZEPAM 2 MG/ML IJ SOLN
0.5000 mg | Freq: Once | INTRAMUSCULAR | Status: AC
  Administered 2014-11-28: 0.5 mg via INTRAVENOUS
  Filled 2014-11-28: qty 1

## 2014-11-28 MED ORDER — ACETAMINOPHEN 500 MG PO TABS: 1000.0000 mg | ORAL_TABLET | Freq: Four times a day (QID) | ORAL | Status: DC | PRN

## 2014-11-28 MED ORDER — FLECAINIDE ACETATE 100 MG PO TABS
100.0000 mg | ORAL_TABLET | Freq: Two times a day (BID) | ORAL | Status: DC
  Administered 2014-11-28 (×2): 100 mg via ORAL
  Filled 2014-11-28 (×4): qty 1

## 2014-11-28 MED ORDER — LIDOCAINE 5 % EX PTCH
1.0000 | MEDICATED_PATCH | CUTANEOUS | Status: DC
  Administered 2014-11-28 – 2014-11-29 (×2): 1 via TRANSDERMAL
  Filled 2014-11-28 (×2): qty 1

## 2014-11-28 MED ORDER — BENZONATATE 100 MG PO CAPS
100.0000 mg | ORAL_CAPSULE | Freq: Three times a day (TID) | ORAL | Status: DC
  Administered 2014-11-28 (×3): 100 mg via ORAL
  Filled 2014-11-28 (×4): qty 1

## 2014-11-28 MED ORDER — LEVALBUTEROL HCL 0.63 MG/3ML IN NEBU
0.6300 mg | INHALATION_SOLUTION | Freq: Three times a day (TID) | RESPIRATORY_TRACT | Status: DC
  Administered 2014-11-28 – 2014-11-30 (×7): 0.63 mg via RESPIRATORY_TRACT
  Filled 2014-11-28 (×8): qty 3

## 2014-11-28 MED ORDER — METOPROLOL TARTRATE 25 MG PO TABS
12.5000 mg | ORAL_TABLET | Freq: Two times a day (BID) | ORAL | Status: DC
  Administered 2014-11-28 (×2): 12.5 mg via ORAL
  Filled 2014-11-28 (×3): qty 1

## 2014-11-28 MED ORDER — FUROSEMIDE 10 MG/ML IJ SOLN
20.0000 mg | Freq: Four times a day (QID) | INTRAMUSCULAR | Status: AC
  Administered 2014-11-28 (×2): 20 mg via INTRAVENOUS
  Filled 2014-11-28: qty 2

## 2014-11-28 NOTE — Progress Notes (Signed)
Rt went to do patients neb. Pt refusing to do. Pt confused taking her hand and pushing her neb away. No distress noted at this time.

## 2014-11-28 NOTE — ED Provider Notes (Signed)
CSN: [id number]     Arrival date & time 11/24/2014  0000 History  By signing my name below, I, Phillis Haggis, attest that this documentation has been prepared under the direction and in the presence of Derwood Kaplan, MD. Electronically Signed: Phillis Haggis, ED Scribe. 11/10/2014. 2:20 AM.  Chief Complaint  Patient presents with  . Shortness of Breath   The history is provided by the patient. No language interpreter was used.  HPI Comments (Level 5 Caveat due to dementia): [name] is a 81 y.o. Female with a hx of AFib and HTN  brought in by EMS who presents to the Emergency Department complaining of SOB onset PTA. Pt was found to be in respiratory distress by her nursing home with her O2 stats being as low as 40% and additional non-productive cough; pt was given 2.5 mg Albuterol. Pt was given 3 breathing treatments en route to the hospital. Pt is not on oxygen at home. Pt denies chest pain, SOB and cough. She is not sure why she is in the hospital.   Past Medical History  Diagnosis Date  . S/P colostomy (HCC) 2008  . Shingles   . Ventral hernia     a. s/p colostomy bag.  . Normocytic anemia   . Fall     a. 08/2014 c/b rib fractures.  . Cellulitis 08/2014  . Prerenal azotemia 08/2014  . A-fib (HCC)   . Hypertension   . Falls frequently   . Muscle weakness   . Lack of coordination   . Dysphagia   . Colostomy in place [facility])    Past Surgical History  Procedure Laterality Date  . Colostomy    . Total abdominal hysterectomy     Family History  Problem Relation Age of Onset  . Heart failure Mother     Took Lasix  . Heart disease Father     Patient thinks father had some sort of heart disease but not sure what kind. No CAD in siblings.  . Prostate cancer Brother   . Diabetes Brother    Social History  Substance Use Topics  . Smoking status: Never Smoker   . Smokeless tobacco: None  . Alcohol Use: No   OB History    No data available     Review of Systems   Unable to perform ROS: Dementia   Allergies  Erythromycin; Penicillins; and Vicodin  Home Medications   Prior to Admission medications   Medication Sig Start Date End Date Taking? Authorizing Provider  acetaminophen (TYLENOL) 500 MG tablet Take 1,000 mg by mouth 3 (three) times daily. Tylenol 500 mg   2 tabs po TID   Yes Historical Provider, MD  apixaban (ELIQUIS) 5 MG TABS tablet Take 1 tablet (5 mg total) by mouth 2 (two) times daily. 10/01/14  Yes Renee Norberto Sorenson, PA-C  benzonatate (TESSALON) 100 MG capsule Take 100 mg by mouth 3 (three) times daily.   Yes Historical Provider, MD  co-enzyme Q-10 30 MG capsule Take 30 mg by mouth daily.     Yes Historical Provider, MD  flecainide (TAMBOCOR) 100 MG tablet Take 1 tablet (100 mg total) by mouth every 12 (twelve) hours. 10/01/14  Yes Renee Norberto Sorenson, PA-C  lidocaine (LIDODERM) 5 % Place 1 patch onto the skin daily. Remove & Discard patch within 12 hours or as directed by MD 08/09/14  Yes Marinda Elk, MD  metoprolol tartrate (LOPRESSOR) 25 MG tablet Take 0.5 tablets (12.5 mg total) by mouth 2 (  two) times daily. 10/01/14  Yes Renee Norberto Sorenson, PA-C  risperiDONE (RISPERDAL) 0.5 MG tablet Take 0.5 mg by mouth every evening. 11/23/14  Yes Historical Provider, MD  saccharomyces boulardii (FLORASTOR) 250 MG capsule Take 250 mg by mouth daily.   Yes Historical Provider, MD  vitamin B-12 (CYANOCOBALAMIN) 1000 MCG tablet Take 1,000 mcg by mouth daily.   Yes Historical Provider, MD  vitamin C (ASCORBIC ACID) 500 MG tablet Take 1,000 mg by mouth daily.   Yes Historical Provider, MD  risperiDONE (RISPERDAL) 0.25 MG tablet Take 1 tablet (0.25 mg total) by mouth 2 (two) times daily. Patient not taking: Reported on 12-15-14 10/01/14   Marily Lente, NP   BP 108/77 mmHg  Pulse 66  Temp(Src) 97.6 F (36.4 C) (Oral)  Resp 23  SpO2 92% Physical Exam  Constitutional: She appears well-developed and well-nourished. No distress.  HENT:  Head:  Normocephalic and atraumatic.  Mouth/Throat: Oropharynx is clear and moist. No oropharyngeal exudate.  Eyes: Conjunctivae and EOM are normal. Pupils are equal, round, and reactive to light. Right eye exhibits no discharge. Left eye exhibits no discharge. No scleral icterus.  Neck: Normal range of motion. Neck supple. No JVD present. No thyromegaly present.  Cardiovascular: Normal rate and intact distal pulses.  An irregularly irregular rhythm present. Exam reveals no gallop and no friction rub.   Murmur heard.  Systolic murmur is present  Pulmonary/Chest: She has no wheezes. She has rales.  Diminished breath sounds on the right side with bibasilar rales  Abdominal: Soft. Bowel sounds are normal. She exhibits no distension and no mass. There is no tenderness.  Musculoskeletal: Normal range of motion. She exhibits edema. She exhibits no tenderness.  3+ pitting edema bilateral lower extremities; no unilateral swelling of lower extremities  Lymphadenopathy:    She has no cervical adenopathy.  Neurological: She is alert. Coordination normal.  Skin: Skin is warm and dry. No rash noted. No erythema.  Psychiatric: She has a normal mood and affect. Her behavior is normal.  Nursing note and vitals reviewed.   ED Course  Procedures (including critical care time) DIAGNOSTIC STUDIES: Oxygen Saturation is 98% on RA, normal by my interpretation.    COORDINATION OF CARE: 12:52 AM-labs, chest x-ray and EKG  Labs Review Labs Reviewed  COMPREHENSIVE METABOLIC PANEL - Abnormal; Notable for the following:    CO2 38 (*)    Glucose, Bld 139 (*)    BUN 35 (*)    Albumin 3.4 (*)    All other components within normal limits  CBC WITH DIFFERENTIAL/PLATELET - Abnormal; Notable for the following:    RBC 3.27 (*)    Hemoglobin 10.4 (*)    MCV 110.1 (*)    MCHC 28.9 (*)    All other components within normal limits  BRAIN NATRIURETIC PEPTIDE - Abnormal; Notable for the following:    B Natriuretic Peptide  262.2 (*)    All other components within normal limits  BLOOD GAS, ARTERIAL - Abnormal; Notable for the following:    pH, Arterial 7.339 (*)    pCO2 arterial 65.0 (*)    pO2, Arterial 77.0 (*)    Bicarbonate 34.1 (*)    Acid-Base Excess 7.2 (*)    All other components within normal limits  I-STAT CG4 LACTIC ACID, ED - Abnormal; Notable for the following:    Lactic Acid, Venous 2.20 (*)    All other components within normal limits  CULTURE, BLOOD (ROUTINE X 2)  CULTURE, BLOOD (ROUTINE X  2)  URINE CULTURE  TROPONIN I  URINALYSIS, ROUTINE W REFLEX MICROSCOPIC (NOT AT [facility])  I-STAT CG4 LACTIC ACID, ED    Imaging Review Dg Chest 2 View  2014-06-07  CLINICAL DATA:  Initial valuation for acute nonproductive cough. EXAM: CHEST  2 VIEW COMPARISON:  Prior study from 09/27/2014. FINDINGS: Cardiomegaly is grossly stable. Mediastinal silhouette within normal limits. Moderate to large right pleural effusion present. Veiling opacity overlying the right lung likely related to the layering pleural effusion. Smaller left pleural effusion also visualized. Bibasilar opacities favored to reflect atelectasis, although superimposed infection not excluded. There is pulmonary vascular congestion without overt pulmonary edema. No pneumothorax. Remote right-sided rib fractures noted, stable. No acute osseus abnormality. Osteopenia noted. Prominent degenerative changes within the visualized spine. IMPRESSION: 1. Moderate to large right pleural effusion with small left pleural effusion. 2. Bibasilar opacity, likely atelectasis and/or effusion. Superimposed infectious infiltrates are not excluded. 3. Prominent perihilar vascular congestion without overt pulmonary edema. Electronically Signed   By: Rise MuBenjamin  McClintock M.D.   On: 2014-06-07 00:52   I have personally reviewed and evaluated these images and lab results as part of my medical decision-making.   EKG Interpretation None      MDM   Final diagnoses:   Acute respiratory failure with hypoxia (HCC)  HCAP (healthcare-associated pneumonia)  Pleural effusion    I personally performed the services described in this documentation, which was scribed in my presence. The recorded information has been reviewed and is accurate.  Pt comes in with resp distress and hypoxia. She has hx of afib. She had a recent neg stress. No CHF hx per echo. Exam consistent with pleural effusion on PNA - and the chest xray is consistent. No elevated WC, she has a temp here. BNP is slightly elevated only.  Unsure what the cause for the pleural effusion is. CHF is possible (has pitting edema) and infection is possible. PE less likely as the cause given the clinical findings. We will cover for HCAP. We will wean her to Nasal canula. We will not give lasix now - as she is stable and potentially stable. WE will admit to medicine - pt will likely need thoracentesis.  CRITICAL CARE Performed by: Derwood KaplanNanavati, Ziggy Reveles   Total critical care time: 50 minutes  Critical care time was exclusive of separately billable procedures and treating other patients.  Critical care was necessary to treat or prevent imminent or life-threatening deterioration.  Critical care was time spent personally by me on the following activities: development of treatment plan with patient and/or surrogate as well as nursing, discussions with consultants, evaluation of patient's response to treatment, examination of patient, obtaining history from patient or surrogate, ordering and performing treatments and interventions, ordering and review of laboratory studies, ordering and review of radiographic studies, pulse oximetry and re-evaluation of patient's condition.   Derwood KaplanAnkit Delesia Martinek, MD 2014-11-21 [phone]

## 2014-11-28 NOTE — ED Notes (Signed)
Bed: WA07 Expected date:  Expected time:  Means of arrival:  Comments: 

## 2014-11-28 NOTE — H&P (Addendum)
[facility] History and Physical  [name] ZOX:[id number] DOB: [birth year] DOA: 2014-12-18  Referring physician: ED PCP: Paulino Rily, MD   Chief Complaint: Shortness of breath  HPI:  [name] is a 81 year old female with past medical history significant for HTN, s/p colostomy, A. Fib, and not oxygen dependent previously;  who comes from [facility] nursing facility with shortness of breath last night around 2230. Patient was apparently initially found to have O2 sats at the facility of 70% for which they immediately called EMS. Prior to EMS arrival the facility gave the patient albuterol. Upon EMS arrival O2 sats were improved, but still around 82%.   In route they provide the patient with 3 rounds of nebulized breathing treatments for which O2 sats were around 94% on arrival to the ED. Patient was seen have a temperature of 32F and leukopenia. Patient was subsequently placed on nonrebreather with improvement O2 sats to 100%. Patient was taken to x-ray where she was found to have moderate right sided pleural effusion with a smaller left pleural effusion. She was taken off nonrebreather at some point subsequently had O2 sats drop to somewhere in the 50s temporarily per nursing. History from patient is minimal due to the history of dementia although she can clearly note pain and shortness of breath.   Review of Systems  Constitutional: Positive for chills.  HENT: Positive for hearing loss.   Eyes: Negative for double vision and photophobia.  Respiratory: Positive for cough, shortness of breath and wheezing.   Cardiovascular: Positive for leg swelling. Negative for chest pain.  Gastrointestinal: Negative for nausea, vomiting and abdominal pain.  Genitourinary: Negative for urgency and frequency.  Musculoskeletal: Negative for back pain and neck pain.  Skin: Negative for itching and rash.  Neurological: Negative for tremors, speech change and headaches.   Endo/Heme/Allergies: Negative for environmental allergies and polydipsia. Bruises/bleeds easily.  Psychiatric/Behavioral: Positive for memory loss. Negative for substance abuse.  All other systems reviewed and are negative.       Past Medical History  Diagnosis Date  . S/P colostomy (HCC) 2008  . Shingles   . Ventral hernia     a. s/p colostomy bag.  . Normocytic anemia   . Fall     a. 08/2014 c/b rib fractures.  . Cellulitis 08/2014  . Prerenal azotemia 08/2014  . A-fib (HCC)   . Hypertension   . Falls frequently   . Muscle weakness   . Lack of coordination   . Dysphagia   . Colostomy in place [facility])      Past Surgical History  Procedure Laterality Date  . Colostomy    . Total abdominal hysterectomy        Social History:  reports that she has never smoked. She does not have any smokeless tobacco history on file. She reports that she does not drink alcohol or use illicit drugs. Where does patient live--SNF Can patient participate in ADLs? Needs assistance  Allergies  Allergen Reactions  . Erythromycin Nausea And Vomiting  . Penicillins Other (See Comments)    Per MAR -   . Vicodin [Hydrocodone-Acetaminophen]     Patient reports history of significant hallucinations and agitation when taking narcotics in the past     Family History  Problem Relation Age of Onset  . Heart failure Mother     Took Lasix  . Heart disease Father     Patient thinks father had some sort of heart disease but not sure what kind. No CAD  in siblings.  . Prostate cancer Brother   . Diabetes Brother        Prior to Admission medications   Medication Sig Start Date End Date Taking? Authorizing Provider  acetaminophen (TYLENOL) 500 MG tablet Take 1,000 mg by mouth 3 (three) times daily. Tylenol 500 mg   2 tabs po TID   Yes Historical Provider, MD  apixaban (ELIQUIS) 5 MG TABS tablet Take 1 tablet (5 mg total) by mouth 2 (two) times daily. 10/01/14  Yes Renee Norberto SorensonLynn Ursuy, PA-C   benzonatate (TESSALON) 100 MG capsule Take 100 mg by mouth 3 (three) times daily.   Yes Historical Provider, MD  co-enzyme Q-10 30 MG capsule Take 30 mg by mouth daily.     Yes Historical Provider, MD  flecainide (TAMBOCOR) 100 MG tablet Take 1 tablet (100 mg total) by mouth every 12 (twelve) hours. 10/01/14  Yes Renee Norberto SorensonLynn Ursuy, PA-C  lidocaine (LIDODERM) 5 % Place 1 patch onto the skin daily. Remove & Discard patch within 12 hours or as directed by MD 08/09/14  Yes Marinda ElkAbraham Feliz Ortiz, MD  metoprolol tartrate (LOPRESSOR) 25 MG tablet Take 0.5 tablets (12.5 mg total) by mouth 2 (two) times daily. 10/01/14  Yes Renee Norberto SorensonLynn Ursuy, PA-C  risperiDONE (RISPERDAL) 0.5 MG tablet Take 0.5 mg by mouth every evening. 11/23/14  Yes Historical Provider, MD  saccharomyces boulardii (FLORASTOR) 250 MG capsule Take 250 mg by mouth daily.   Yes Historical Provider, MD  vitamin B-12 (CYANOCOBALAMIN) 1000 MCG tablet Take 1,000 mcg by mouth daily.   Yes Historical Provider, MD  vitamin C (ASCORBIC ACID) 500 MG tablet Take 1,000 mg by mouth daily.   Yes Historical Provider, MD  risperiDONE (RISPERDAL) 0.25 MG tablet Take 1 tablet (0.25 mg total) by mouth 2 (two) times daily. Patient not taking: Reported on 11/13/2014 10/01/14   Marily LenteAmber K Seiler, NP     Physical Exam: Filed Vitals:   11/15/2014 0215 11/04/2014 0230 11/18/2014 0245 11/21/2014 0300  BP:  109/37 121/54 125/48  Pulse: 80  74 75  Temp:      TempSrc:      Resp: 20 15 19 23   SpO2: 98%  100% 94%     Constitutional: Vital signs reviewed. Patient is a elderly female who appears sick; but nontoxic shivering despite being under multiple blankets. Head: Normocephalic and atraumatic  Ear: TM normal bilaterally  Mouth: no erythema or exudates, MMM  Eyes: PERRL, EOMI, conjunctivae normal, No scleral icterus.  Neck: Supple, Trachea midline normal ROM, No JVD, mass, thyromegaly, or carotid bruit present.  Cardiovascular: Irregular regular heart rhythm with positive  systolic ejection murmur  2 out of 6 , pulses symmetric and intact bilaterally  Pulmonary/Chest: Diminished breath sounds on the right side with rales appreciated Abdominal: Soft. Non-tender, non-distended, bowel sounds are normal, no masses, organomegaly, or guarding present.  GU: no CVA tenderness Musculoskeletal: No joint deformities, erythema, or stiffness, ROM full and no nontender Ext: +3 pitting edema of lower extremities. No cyanosis, pulses palpable bilaterally (DP and PT)  Hematology: no cervical, inginal, or axillary adenopathy.  Neurological: alert to person, Strenght is normal and symmetric bilaterally, cranial nerve II-XII are grossly intact, no focal motor deficit, sensory intact to light touch bilaterally.  Skin: Warm, dry and intact. No rash, cyanosis, or clubbing.  Psychiatric: Patient has dementia     Data Review   Micro Results No results found for this or any previous visit (from the past 240 hour(s)).  Radiology Reports Dg  Chest 2 View  11/21/2014  CLINICAL DATA:  Initial valuation for acute nonproductive cough. EXAM: CHEST  2 VIEW COMPARISON:  Prior study from 09/27/2014. FINDINGS: Cardiomegaly is grossly stable. Mediastinal silhouette within normal limits. Moderate to large right pleural effusion present. Veiling opacity overlying the right lung likely related to the layering pleural effusion. Smaller left pleural effusion also visualized. Bibasilar opacities favored to reflect atelectasis, although superimposed infection not excluded. There is pulmonary vascular congestion without overt pulmonary edema. No pneumothorax. Remote right-sided rib fractures noted, stable. No acute osseus abnormality. Osteopenia noted. Prominent degenerative changes within the visualized spine. IMPRESSION: 1. Moderate to large right pleural effusion with small left pleural effusion. 2. Bibasilar opacity, likely atelectasis and/or effusion. Superimposed infectious infiltrates are not excluded.  3. Prominent perihilar vascular congestion without overt pulmonary edema. Electronically Signed   By: Rise Mu M.D.   On: 11/13/2014 00:52     CBC  Recent Labs Lab 11/24/2014 0132  WBC 4.4  HGB 10.4*  HCT 36.0  PLT 157  MCV 110.1*  MCH 31.8  MCHC 28.9*  RDW 14.9  LYMPHSABS 0.8  MONOABS 0.6  EOSABS 0.0  BASOSABS 0.0    Chemistries   Recent Labs Lab 11/16/2014 0132  NA 145  K 4.6  CL 101  CO2 38*  GLUCOSE 139*  BUN 35*  CREATININE 0.73  CALCIUM 9.3  AST 23  ALT 26  ALKPHOS 75  BILITOT 0.4   ------------------------------------------------------------------------------------------------------------------ estimated creatinine clearance is 48.4 mL/min (by C-G formula based on Cr of 0.73). ------------------------------------------------------------------------------------------------------------------ No results for input(s): HGBA1C in the last 72 hours. ------------------------------------------------------------------------------------------------------------------ No results for input(s): CHOL, HDL, LDLCALC, TRIG, CHOLHDL, LDLDIRECT in the last 72 hours. ------------------------------------------------------------------------------------------------------------------ No results for input(s): TSH, T4TOTAL, T3FREE, THYROIDAB in the last 72 hours.  Invalid input(s): FREET3 ------------------------------------------------------------------------------------------------------------------ No results for input(s): VITAMINB12, FOLATE, FERRITIN, TIBC, IRON, RETICCTPCT in the last 72 hours.  Coagulation profile No results for input(s): INR, PROTIME in the last 168 hours.  No results for input(s): DDIMER in the last 72 hours.  Cardiac Enzymes  Recent Labs Lab 11/09/2014 0132  TROPONINI <0.03   ------------------------------------------------------------------------------------------------------------------ Invalid input(s): POCBNP   CBG: No results for  input(s): GLUCAP in the last 168 hours.     EKG: Independently reviewed. Atrial fibrillation   Assessment/Plan Principal Problem:   Respiratory failure with hypoxia and hypercapnia (HCC) acute. Patient found with O2 sats in the 70s on room air at nursing facility. Upon arrival placed on nonrebreather for mask able to be switched to nasal cannula 4L with ABG pH-7.339, PCO2- 65, bicarbonate of 38, PO2-77, saturations 95.3%. Respiratory acidosis. Question the possibility of underlying pneumonia  -Levalbuterol and ipratropium duo nebs every 6 hours scheduled and when necessary -Changed to high flow nasal cannula oxygen, Repeat ABG in 1 hour -IR consulted in a.m. for possibility of thoracentesis  -Checking sputum cultures and blood cultures -Checking d-dimer  Possible sepsis: Given patient's leukopenia, hypothermia, and hypoxia on admission -Check for possible sources of infection -Empiric antibiotics of Vanc and Fortaz per pharmacy   Pleural effusion, right: Acute. Moderate pleural effusion seen on the right lung on chest x-ray. Likely worsening symptoms as seen above -IR to assess in a.m. for the possibility of a thoracentesis. -patient NPO   Diastolic dysfunction with heart failure (HCC) initial BMP elevated at 250. Previous echocardiogram from  09/28/2014 shows ejection fraction of 60-65% with grade 1 diastolic dysfunction. -Strict ins and outs -Gentle diuresis Lasix IV 20 mg q6 hour x 2 doses  reassess patient fluid status -Check daily weights -Insert Foley for accurate monitoring  S/P colostomy (HCC) -Consult wound care for ostomy Care   Lower extremity Edema: +3 pitting edema bilateral lower extremities. -TED hose  -Lasix as seen above  Atrial fibrillation (HCC)Appears rate controlled at this time with heart rates in the 70s. -Patient on anticoagulation Eliquis *holding this prior to IR thoracentesis  Hypoalbuminemia -  Dementia without behavioral disturbance: Stable    Code Status:   full Family Communication: bedside Disposition Plan: admit   Total time spent 55 minutes.Greater than 50% of this time was spent in counseling, explanation of diagnosis, planning of further management, and coordination of care  Clydie Braun [facility] Pager [phone]  If 7PM-7AM, please contact night-coverage [email or web address] Password [facility] 12/18/14, 3:34 AM

## 2014-11-28 NOTE — Progress Notes (Signed)
ANTIBIOTIC CONSULT NOTE - INITIAL  Pharmacy Consult for Vancomycin, ceftazidime Indication: sepsis/HCAP  Allergies  Allergen Reactions  . Erythromycin Nausea And Vomiting  . Penicillins Other (See Comments)    Per MAR -   . Vicodin [Hydrocodone-Acetaminophen]     Patient reports history of significant hallucinations and agitation when taking narcotics in the past     Patient Measurements: Height: 5' (152.4 cm) Weight: 172 lb 2.9 oz (78.1 kg) IBW/kg (Calculated) : 45.5 Adjusted Body Weight:   Vital Signs: Temp: 97.5 F (36.4 C) (11/27 0435) Temp Source: Oral (11/27 0435) BP: 105/36 mmHg (11/27 0435) Pulse Rate: 68 (11/27 0522) Intake/Output from previous day:   Intake/Output from this shift:    Labs:  Recent Labs  07/26/2014 0132  WBC 4.4  HGB 10.4*  PLT 157  CREATININE 0.73   Estimated Creatinine Clearance: 48.3 mL/min (by C-G formula based on Cr of 0.73). No results for input(s): VANCOTROUGH, VANCOPEAK, VANCORANDOM, GENTTROUGH, GENTPEAK, GENTRANDOM, TOBRATROUGH, TOBRAPEAK, TOBRARND, AMIKACINPEAK, AMIKACINTROU, AMIKACIN in the last 72 hours.   Microbiology: No results found for this or any previous visit (from the past 720 hour(s)).  Medical History: Past Medical History  Diagnosis Date  . S/P colostomy (HCC) 2008  . Shingles   . Ventral hernia     a. s/p colostomy bag.  . Normocytic anemia   . Fall     a. 08/2014 c/b rib fractures.  . Cellulitis 08/2014  . Prerenal azotemia 08/2014  . A-fib (HCC)   . Hypertension   . Falls frequently   . Muscle weakness   . Lack of coordination   . Dysphagia   . Colostomy in place [facility](HCC)     Medications:  Anti-infectives    Start     Dose/Rate Route Frequency Ordered Stop   07/26/2014 1800  vancomycin (VANCOCIN) IVPB 750 mg/150 ml premix     750 mg 150 mL/hr over 60 Minutes Intravenous Every 12 hours 07/26/2014 0551     07/26/2014 0315  cefTAZidime (FORTAZ) 1 g in dextrose 5 % 50 mL IVPB     1 g 100 mL/hr over 30  Minutes Intravenous 3 times per day 07/26/2014 0305     07/26/2014 0245  vancomycin (VANCOCIN) IVPB 1000 mg/200 mL premix     1,000 mg 200 mL/hr over 60 Minutes Intravenous  Once 07/26/2014 0231 07/26/2014 0400     Assessment: Patient with HCAP/sepsis.  First dose of antibiotics already given.  Goal of Therapy:  Vancomycin trough level 15-20 mcg/ml  Ceftazidime dosed based on patient weight and renal function   Plan:  Measure antibiotic drug levels at steady state Follow up culture results Vancomycin 750mg  iv q12hr  Ceftazidime 1gm iv q8hr  Darlina GuysGrimsley Jr, Jacquenette ShoneJulian Crowford 02/13/14,5:54 AM

## 2014-11-28 NOTE — Progress Notes (Signed)
Lab unable to draw labs, Dr. Blake DivineAkula notified.

## 2014-11-28 NOTE — Progress Notes (Signed)
81 YEAR OLD lady residing at SNF, presents with sob and vomiting. She was admitted for acute on chronic respiratory failure with hypoxia and is requiring 4 liters of [place] oxygen. She is confused and delirious. She is scheduled for US thoracentesis tomorrow and starte don IV antibiotics for presumed pneumonia.  Discussed with sister and friend and changed to DNR as per her wishes in the past.  Continue to monitor.   Martha ModyVijaya Keonta Monceaux, MD [phone]

## 2014-11-28 NOTE — ED Notes (Signed)
Pt arrives to the ER via EMS for complaints of NP cough and shortness of breath; [facility] that around 2230 pt was complaining of shortness of breath and cough; Facility provided 2.5mg  Albuterol HHN and called EMS; upon EMS arrival they report that pt O2 sats were 82%; EMS reports that they gave 5mg  Albuterol; they report O2 sats up to 94% on neb; NP cough and wheezing; EMS reports that they gave an additional 5mg  Albuterol and 0.5mg  Atrovent en route to the ER; EMS reports pt was not following commands, alert and able to answer some questions; pt c/o feet and back pain and generalized not feeling well.

## 2014-11-29 DIAGNOSIS — J9602 Acute respiratory failure with hypercapnia: Secondary | ICD-10-CM

## 2014-11-29 DIAGNOSIS — Z789 Other specified health status: Secondary | ICD-10-CM

## 2014-11-29 DIAGNOSIS — J9601 Acute respiratory failure with hypoxia: Secondary | ICD-10-CM

## 2014-11-29 LAB — COMPREHENSIVE METABOLIC PANEL
ALBUMIN: 3 g/dL — AB (ref 3.5–5.0)
ALT: 17 U/L (ref 14–54)
ANION GAP: 6 (ref 5–15)
AST: 14 U/L — AB (ref 15–41)
Alkaline Phosphatase: 57 U/L (ref 38–126)
BUN: 36 mg/dL — AB (ref 6–20)
CHLORIDE: 97 mmol/L — AB (ref 101–111)
CO2: 40 mmol/L — AB (ref 22–32)
Calcium: 9.1 mg/dL (ref 8.9–10.3)
Creatinine, Ser: 0.74 mg/dL (ref 0.44–1.00)
GFR calc Af Amer: >60 mL/min (ref >60)
GFR calc non Af Amer: >60 mL/min (ref >60)
GLUCOSE: 104 mg/dL — AB (ref 65–99)
POTASSIUM: 4.4 mmol/L (ref 3.5–5.1)
SODIUM: 143 mmol/L (ref 135–145)
Total Bilirubin: 0.7 mg/dL (ref 0.3–1.2)
Total Protein: 5.9 g/dL — ABNORMAL LOW (ref 6.5–8.1)

## 2014-11-29 LAB — CBC
HEMATOCRIT: 30.7 % — AB (ref 36.0–46.0)
Hemoglobin: 9.1 g/dL — ABNORMAL LOW (ref 12.0–15.0)
MCH: 31.6 pg (ref 26.0–34.0)
MCHC: 29.6 g/dL — AB (ref 30.0–36.0)
MCV: 106.6 fL — ABNORMAL HIGH (ref 78.0–100.0)
PLATELETS: 137 10*3/uL — AB (ref 150–400)
RBC: 2.88 MIL/uL — ABNORMAL LOW (ref 3.87–5.11)
RDW: 14.8 % (ref 11.5–15.5)
WBC: 1.9 10*3/uL — AB (ref 4.0–10.5)

## 2014-11-29 LAB — LACTIC ACID, PLASMA: Lactic Acid, Venous: 0.4 mmol/L — ABNORMAL LOW (ref 0.5–2.0)

## 2014-11-29 MED ORDER — GLYCOPYRROLATE 0.2 MG/ML IJ SOLN
0.2000 mg | INTRAMUSCULAR | Status: DC | PRN
  Filled 2014-11-29: qty 1

## 2014-11-29 MED ORDER — HALOPERIDOL LACTATE 5 MG/ML IJ SOLN: 2.0000 mg | Freq: Four times a day (QID) | INTRAMUSCULAR | Status: DC | PRN

## 2014-11-29 MED ORDER — ONDANSETRON 4 MG PO TBDP: 4.0000 mg | ORAL_TABLET | Freq: Four times a day (QID) | ORAL | Status: DC | PRN

## 2014-11-29 MED ORDER — HALOPERIDOL LACTATE 5 MG/ML IJ SOLN: 0.5000 mg | INTRAMUSCULAR | Status: DC | PRN

## 2014-11-29 MED ORDER — LORAZEPAM 2 MG/ML PO CONC: 1.0000 mg | ORAL | Status: DC | PRN

## 2014-11-29 MED ORDER — FUROSEMIDE 10 MG/ML IJ SOLN
40.0000 mg | Freq: Every day | INTRAMUSCULAR | Status: DC
  Administered 2014-11-29 – 2014-11-30 (×2): 40 mg via INTRAVENOUS
  Filled 2014-11-29 (×2): qty 4

## 2014-11-29 MED ORDER — ONDANSETRON HCL 4 MG/2ML IJ SOLN: 4.0000 mg | Freq: Four times a day (QID) | INTRAMUSCULAR | Status: DC | PRN

## 2014-11-29 MED ORDER — HALOPERIDOL 0.5 MG PO TABS
0.5000 mg | ORAL_TABLET | ORAL | Status: DC | PRN
  Filled 2014-11-29: qty 1

## 2014-11-29 MED ORDER — LORAZEPAM 2 MG/ML IJ SOLN
1.0000 mg | INTRAMUSCULAR | Status: DC | PRN
  Administered 2014-11-29: 1 mg via INTRAVENOUS
  Filled 2014-11-29: qty 1

## 2014-11-29 MED ORDER — BIOTENE DRY MOUTH MT LIQD: 15.0000 mL | OROMUCOSAL | Status: DC | PRN

## 2014-11-29 MED ORDER — ATROPINE SULFATE 1 % OP SOLN
2.0000 [drp] | Freq: Four times a day (QID) | OPHTHALMIC | Status: DC
  Administered 2014-11-29 – 2014-11-30 (×3): 2 [drp] via SUBLINGUAL
  Filled 2014-11-29: qty 2

## 2014-11-29 MED ORDER — LORAZEPAM 1 MG PO TABS: 1.0000 mg | ORAL_TABLET | ORAL | Status: DC | PRN

## 2014-11-29 MED ORDER — ACETAMINOPHEN 650 MG RE SUPP: 650.0000 mg | Freq: Four times a day (QID) | RECTAL | Status: DC | PRN

## 2014-11-29 MED ORDER — POLYVINYL ALCOHOL 1.4 % OP SOLN
1.0000 [drp] | Freq: Four times a day (QID) | OPHTHALMIC | Status: DC | PRN
  Filled 2014-11-29: qty 15

## 2014-11-29 MED ORDER — HYDROMORPHONE HCL 1 MG/ML IJ SOLN
0.5000 mg | INTRAMUSCULAR | Status: DC | PRN
  Administered 2014-11-29 – 2014-11-30 (×3): 0.5 mg via INTRAVENOUS
  Filled 2014-11-29 (×3): qty 1

## 2014-11-29 MED ORDER — ACETAMINOPHEN 325 MG PO TABS: 650.0000 mg | ORAL_TABLET | Freq: Four times a day (QID) | ORAL | Status: DC | PRN

## 2014-11-29 MED ORDER — GLYCOPYRROLATE 1 MG PO TABS
1.0000 mg | ORAL_TABLET | ORAL | Status: DC | PRN
  Filled 2014-11-29: qty 1

## 2014-11-29 MED ORDER — HALOPERIDOL LACTATE 2 MG/ML PO CONC
0.5000 mg | ORAL | Status: DC | PRN
  Filled 2014-11-29: qty 0.3

## 2014-11-29 NOTE — Progress Notes (Signed)
Called to patient's room due to desaturation on 50% High Flow nasal cannula. Found patient on venti mask upon arrival to room and placed high flow back on and gradually increased to 100% with 60L of flow. Patient is agonal/belly breathing with decreased breath sounds with moderate rhonchi. Suggest BiPAP, but RN to call MD and speak with family. RT will continue to monitor.

## 2014-11-29 NOTE — Progress Notes (Signed)
[facility] PROGRESS NOTE  [name] UEA:[id number] DOB: 28-Jan-1928 DOA: 11/13/2014 PCP: Paulino RilyJONES,ENRICO G, MD  Assessment/Plan: 1. Acute hypoxic and hypercapnic respiratory failure with pneumonia: Requiring 4 lit of [place] oxygen, .  Family at bedside and very understanding.  She was initially starte don IV antibiotics and IR consulted for thoracentesis.  Palliative care consulted and family decided for full comfort measures.  Antibiotics and procedures discontinued and transition to residential hospice when bed available.  Appreciate palliative care team recommendations.   Code Status: DNR Family Communication: SISTER AND friend at bedside Disposition Plan: residential hospice when bed available.    Consultants:  Palliative care.   Procedures:  none  Antibiotics:  none  HPI/Subjective: Family at bedside.   Objective: Filed Vitals:   11/29/14 0543 11/29/14 1434  BP: 142/48 122/50  Pulse:  87  Temp: 97.9 F (36.6 C) 97.6 F (36.4 C)  Resp:  18    Intake/Output Summary (Last 24 hours) at 11/29/14 1647 Last data filed at 11/29/14 1445  Gross per 24 hour  Intake    370 ml  Output   1300 ml  Net   -930 ml   Filed Weights   11/03/2014 0435 11/29/14 0500  Weight: 78.1 kg (172 lb 2.9 oz) 80 kg (176 lb 5.9 oz)    Exam:   General:  Delirious and confused  Cardiovascular: tachycardic  Respiratory: decreased breath sounds more ont he right than on left  Abdomen: soft bowel sounds heard  Musculoskeletal: pedal edema. 2+  Data Reviewed: Basic Metabolic Panel:  Recent Labs Lab 11/07/2014 0132 11/29/14 0525  NA 145 143  K 4.6 4.4  CL 101 97*  CO2 38* 40*  GLUCOSE 139* 104*  BUN 35* 36*  CREATININE 0.73 0.74  CALCIUM 9.3 9.1   Liver Function Tests:  Recent Labs Lab 11/20/2014 0132 11/29/14 0525  AST 23 14*  ALT 26 17  ALKPHOS 75 57  BILITOT 0.4 0.7  PROT 6.5 5.9*  ALBUMIN 3.4* 3.0*   No results for input(s): LIPASE, AMYLASE in the  last 168 hours. No results for input(s): AMMONIA in the last 168 hours. CBC:  Recent Labs Lab 11/22/2014 0132 11/29/14 0525  WBC 4.4 1.9*  NEUTROABS 3.0  --   HGB 10.4* 9.1*  HCT 36.0 30.7*  MCV 110.1* 106.6*  PLT 157 137*   Cardiac Enzymes:  Recent Labs Lab 11/21/2014 0132  TROPONINI <0.03   BNP (last 3 results)  Recent Labs  09/27/14 1817 11/13/2014 0132  BNP 100.5* 262.2*    ProBNP (last 3 results) No results for input(s): PROBNP in the last 8760 hours.  CBG: No results for input(s): GLUCAP in the last 168 hours.  Recent Results (from the past 240 hour(s))  Blood Culture (routine x 2)     Status: None (Preliminary result)   Collection Time: 11/18/2014  1:32 AM  Result Value Ref Range Status   Specimen Description BLOOD RIGHT ANTECUBITAL  Final   Special Requests BOTTLES DRAWN AEROBIC AND ANAEROBIC 5CC  Final   Culture   Final    NO GROWTH 1 DAY [facility]    Report Status PENDING  Incomplete  Blood Culture (routine x 2)     Status: None (Preliminary result)   Collection Time: 11/20/2014  1:50 AM  Result Value Ref Range Status   Specimen Description BLOOD RIGHT HAND  Final   Special Requests IN PEDIATRIC BOTTLE [facility]3CC  Final   Culture   Final  NO GROWTH 1 DAY Performed at [facility]    Report Status PENDING  Incomplete  Urine culture     Status: None (Preliminary result)   Collection Time: 11/24/2014  3:35 AM  Result Value Ref Range Status   Specimen Description URINE, RANDOM  Final   Special Requests NONE  Final   Culture   Final    CULTURE REINCUBATED FOR BETTER GROWTH Performed at [facility]    Report Status PENDING  Incomplete  MRSA PCR Screening     Status: Abnormal   Collection Time: 11/29/2014 12:31 PM  Result Value Ref Range Status   MRSA by PCR INVALID RESULTS, SPECIMEN SENT FOR CULTURE (A) NEGATIVE Final    Comment: RESULT CALLED TO, READ BACK BY AND VERIFIED WITH: [email or web address] AT 1755 ON 11/29/2014 BY  S.VANHOORNE        The GeneXpert MRSA Assay (FDA approved for NASAL specimens only), is one component of a comprehensive MRSA colonization surveillance program. It is not intended to diagnose MRSA infection nor to guide or monitor treatment for MRSA infections.      Studies: Dg Chest 2 View  11/27/2014  CLINICAL DATA:  Initial valuation for acute nonproductive cough. EXAM: CHEST  2 VIEW COMPARISON:  Prior study from 09/27/2014. FINDINGS: Cardiomegaly is grossly stable. Mediastinal silhouette within normal limits. Moderate to large right pleural effusion present. Veiling opacity overlying the right lung likely related to the layering pleural effusion. Smaller left pleural effusion also visualized. Bibasilar opacities favored to reflect atelectasis, although superimposed infection not excluded. There is pulmonary vascular congestion without overt pulmonary edema. No pneumothorax. Remote right-sided rib fractures noted, stable. No acute osseus abnormality. Osteopenia noted. Prominent degenerative changes within the visualized spine. IMPRESSION: 1. Moderate to large right pleural effusion with small left pleural effusion. 2. Bibasilar opacity, likely atelectasis and/or effusion. Superimposed infectious infiltrates are not excluded. 3. Prominent perihilar vascular congestion without overt pulmonary edema. Electronically Signed   By: Rise Mu M.D.   On: 11/20/2014 00:52    Scheduled Meds: . furosemide  40 mg Intravenous Daily  . levalbuterol  0.63 mg Nebulization TID   Continuous Infusions:   Principal Problem:   Respiratory failure with hypoxia and hypercapnia (HCC) Active Problems:   S/P colostomy (HCC)   Edema   Atrial fibrillation (HCC)   Dementia without behavioral disturbance   Diastolic dysfunction with heart failure (HCC)   Pleural effusion, right    Time spent: 25 min    Jemel Ono  [facility] Pager [phone] If 7PM-7AM, please contact night-coverage  at [email or web address], password [facility] 11/29/2014, 4:47 PM  LOS: 1 day

## 2014-11-29 NOTE — Progress Notes (Signed)
CSW spoke with family and they have selected [facility]- referral faxed and they plan to speak with family and hope to facilitate transfer tomorrow if MD ok's dc.  Full assessment to follow  Reece LevyJanet Karina Nofsinger, MSW, LongLCSWA  [phone]

## 2014-11-29 NOTE — Consult Note (Signed)
Consultation Note Date: 11/29/2014   Patient Name: [name]  DOB: 09-May-1928  MRN: [id number]  Age / Sex: 81 y.o., female  PCP: Knox Royalty, MD Referring Physician: Kathlen Mody, MD  Reason for Consultation: Establishing goals of care and Pain control    Clinical Assessment/Narrative: 81 yo from SNF with declining mental status, weakness, worsening anasarca admitted with acute respiratory failure -large effusion on CXR, pulmonary edema and hypertension. She is unresponsive, unable to swallow or take PO medications. Low albumin, leukopenia, macrocytosis. She did not desire traditional medical care - per family she was told she had cancer 12 years ago- details are unclear but she saw naturopathic doctor only.   Family state that she was debilitated and tired- they feel comfort care is the best approach.  Contacts/Participants in Discussion: Sister, Brother, [place] and caregiver Sheralyn Boatman Primary Decision Maker: Sheralyn Boatman, sister Kathie Rhodes   Relationship to Patient friend, sister HCPOA: yes    SUMMARY OF RECOMMENDATIONS  Code Status/Advance Care Planning: DNR    Code Status Orders        Start     Ordered   11/29/14 1345  Do not attempt resuscitation (DNR)   Continuous    Question Answer Comment  In the event of cardiac or respiratory ARREST Do not call a "code blue"   In the event of cardiac or respiratory ARREST Do not perform Intubation, CPR, defibrillation or ACLS   In the event of cardiac or respiratory ARREST Use medication by any route, position, wound care, and other measures to relive pain and suffering. May use oxygen, suction and manual treatment of airway obstruction as needed for comfort.      11/29/14 1346      Other Directives:None  Symptom Management:   FULL COMFORT CARE  Dilaudid for Dyspnea and Pain  Lasix and Foley for diuresis  Ativan and Haldol for agitation PRN  No  thoracentesis  No Antibiotics  Palliative Prophylaxis:   Bowel Regimen  Additional Recommendations (Limitations, Scope, Preferences):  Avoid Hospitalization, Full Comfort Care, Minimize Medications, No IV Antibiotics, No IV Fluids, No Lab Draws and No Surgical Procedures   Psycho-social/Spiritual:  Support System: Fair Desire for further Chaplaincy support:yes Additional Recommendations: Caregiving  Support/Resources  Prognosis: Hours - Days  Discharge Planning: Hospice facility- FAMILY SPECIFICALLY REQUESTING [facility]   Chief Complaint/ Primary Diagnoses: Present on Admission:  . Respiratory failure with hypoxia and hypercapnia (HCC) . Atrial fibrillation (HCC) . Dementia without behavioral disturbance . Edema . Diastolic dysfunction with heart failure (HCC) . Pleural effusion, right  I have reviewed the medical record, interviewed the patient and family, and examined the patient. The following aspects are pertinent.  Past Medical History  Diagnosis Date  . S/P colostomy (HCC) 2008  . Shingles   . Ventral hernia     a. s/p colostomy bag.  . Normocytic anemia   . Fall     a. 08/2014 c/b rib fractures.  . Cellulitis 08/2014  . Prerenal azotemia 08/2014  . A-fib (HCC)   . Hypertension   . Falls frequently   . Muscle weakness   . Lack of coordination   . Dysphagia   . Colostomy in place [facility])    Social History   Social History  . Marital Status: Single    Spouse Name: N/A  . Number of Children: N/A  . Years of Education: N/A   Social History Main Topics  . Smoking status: Never Smoker   . Smokeless tobacco: None  .  Alcohol Use: No  . Drug Use: No  . Sexual Activity: Not Asked   Other Topics Concern  . None   Social History Narrative   Family History  Problem Relation Age of Onset  . Heart failure Mother     Took Lasix  . Heart disease Father     Patient thinks father had some sort of heart disease but not sure what kind. No CAD in  siblings.  . Prostate cancer Brother   . Diabetes Brother    Scheduled Meds: . furosemide  40 mg Intravenous Daily  . levalbuterol  0.63 mg Nebulization TID   Continuous Infusions:  PRN Meds:.acetaminophen **OR** acetaminophen, acetaminophen, antiseptic oral rinse, glycopyrrolate **OR** glycopyrrolate **OR** glycopyrrolate, haloperidol **OR** haloperidol **OR** haloperidol lactate, haloperidol lactate, HYDROmorphone (DILAUDID) injection, levalbuterol, LORazepam **OR** LORazepam **OR** LORazepam, ondansetron **OR** ondansetron (ZOFRAN) IV, polyvinyl alcohol Medications Prior to Admission:  Prior to Admission medications   Medication Sig Start Date End Date Taking? Authorizing Provider  acetaminophen (TYLENOL) 500 MG tablet Take 1,000 mg by mouth 3 (three) times daily. Tylenol 500 mg   2 tabs po TID   Yes Historical Provider, MD  apixaban (ELIQUIS) 5 MG TABS tablet Take 1 tablet (5 mg total) by mouth 2 (two) times daily. 10/01/14  Yes Renee Norberto SorensonLynn Ursuy, PA-C  benzonatate (TESSALON) 100 MG capsule Take 100 mg by mouth 3 (three) times daily.   Yes Historical Provider, MD  co-enzyme Q-10 30 MG capsule Take 30 mg by mouth daily.     Yes Historical Provider, MD  flecainide (TAMBOCOR) 100 MG tablet Take 1 tablet (100 mg total) by mouth every 12 (twelve) hours. 10/01/14  Yes Renee Norberto SorensonLynn Ursuy, PA-C  lidocaine (LIDODERM) 5 % Place 1 patch onto the skin daily. Remove & Discard patch within 12 hours or as directed by MD 08/09/14  Yes Marinda ElkAbraham Feliz Ortiz, MD  metoprolol tartrate (LOPRESSOR) 25 MG tablet Take 0.5 tablets (12.5 mg total) by mouth 2 (two) times daily. 10/01/14  Yes Renee Norberto SorensonLynn Ursuy, PA-C  risperiDONE (RISPERDAL) 0.5 MG tablet Take 0.5 mg by mouth every evening. 11/23/14  Yes Historical Provider, MD  saccharomyces boulardii (FLORASTOR) 250 MG capsule Take 250 mg by mouth daily.   Yes Historical Provider, MD  vitamin B-12 (CYANOCOBALAMIN) 1000 MCG tablet Take 1,000 mcg by mouth daily.   Yes Historical  Provider, MD  vitamin C (ASCORBIC ACID) 500 MG tablet Take 1,000 mg by mouth daily.   Yes Historical Provider, MD  risperiDONE (RISPERDAL) 0.25 MG tablet Take 1 tablet (0.25 mg total) by mouth 2 (two) times daily. Patient not taking: Reported on 01-07-14 10/01/14   Marily LenteAmber K Seiler, NP   Allergies  Allergen Reactions  . Erythromycin Nausea And Vomiting  . Penicillins Other (See Comments)    Per MAR -   . Vicodin [Hydrocodone-Acetaminophen]     Patient reports history of significant hallucinations and agitation when taking narcotics in the past     Review of Systems  Unable to perform ROS   Physical Exam  Constitutional: She appears distressed.  Cardiovascular: An irregularly irregular rhythm present. Tachycardia present.   Respiratory: Accessory muscle usage present. Tachypnea noted.  GI: She exhibits distension.  Skin: She is diaphoretic. There is pallor.    Vital Signs: BP 142/48 mmHg  Pulse 63  Temp(Src) 97.9 F (36.6 C) (Axillary)  Resp 18  Ht 5' (1.524 m)  Wt 80 kg (176 lb 5.9 oz)  BMI 34.44 kg/m2  SpO2 94%  SpO2: SpO2: 94 %  O2 Device:SpO2: 94 % O2 Flow Rate: .O2 Flow Rate (L/min): 20 L/min  IO: Intake/output summary:  Intake/Output Summary (Last 24 hours) at 11/29/14 1349 Last data filed at 11/29/14 0631  Gross per 24 hour  Intake    470 ml  Output   2000 ml  Net  -1530 ml    LBM:   Baseline Weight: Weight: 78.1 kg (172 lb 2.9 oz) Most recent weight: Weight: 80 kg (176 lb 5.9 oz)      Palliative Assessment/Data:  Flowsheet Rows        Most Recent Value   Intake Tab    Referral Department  Hospitalist   Unit at Time of Referral  Cardiac/Telemetry Unit   Palliative Care Primary Diagnosis  Sepsis/Infectious Disease   Date Notified  11/29/14   Palliative Care Type  New Palliative care   Reason for referral  Clarify Goals of Care   Date of Admission  24-Dec-2014   Date first seen by Palliative Care  11/29/14   # of days Palliative referral response time   0 Day(s)   # of days IP prior to Palliative referral  1   Clinical Assessment    Palliative Performance Scale Score  10%   Pain Max last 24 hours  Not able to report   Pain Min Last 24 hours  Not able to report   Dyspnea Max Last 24 Hours  Not able to report   Dyspnea Min Last 24 hours  Not able to report   Nausea Max Last 24 Hours  Not able to report   Nausea Min Last 24 Hours  Not able to report   Anxiety Max Last 24 Hours  Not able to report   Anxiety Min Last 24 Hours  Not able to report   Other Max Last 24 Hours  Not able to report   Psychosocial & Spiritual Assessment    Social Work Plan of Care  Education on Hospice   Palliative Care Outcomes    Patient/Family meeting held?  Yes   Palliative Care Outcomes  Changed to focus on comfort   Patient/Family wishes: Interventions discontinued/not started   Antibiotics   Palliative Care follow-up planned  No   Other Treatment Preference Instructions  -- [Hospice Facility]      Additional Data Reviewed:  CBC:    Component Value Date/Time   WBC 1.9* 11/29/2014 0525   HGB 9.1* 11/29/2014 0525   HCT 30.7* 11/29/2014 0525   PLT 137* 11/29/2014 0525   MCV 106.6* 11/29/2014 0525   NEUTROABS 3.0 12-24-14 0132   LYMPHSABS 0.8 December 24, 2014 0132   MONOABS 0.6 2014/12/24 0132   EOSABS 0.0 24-Dec-2014 0132   BASOSABS 0.0 December 24, 2014 0132   Comprehensive Metabolic Panel:    Component Value Date/Time   NA 143 11/29/2014 0525   K 4.4 11/29/2014 0525   CL 97* 11/29/2014 0525   CO2 40* 11/29/2014 0525   BUN 36* 11/29/2014 0525   CREATININE 0.74 11/29/2014 0525   GLUCOSE 104* 11/29/2014 0525   CALCIUM 9.1 11/29/2014 0525   AST 14* 11/29/2014 0525   ALT 17 11/29/2014 0525   ALKPHOS 57 11/29/2014 0525   BILITOT 0.7 11/29/2014 0525   PROT 5.9* 11/29/2014 0525   ALBUMIN 3.0* 11/29/2014 0525     Time In: 12:50 Time Out: 1: 55  Time Total: 75 min Greater than 50%  of this time was spent counseling and coordinating care related to  the above assessment and plan.  Signed by: Anderson Malta,  DO  Edsel Petrin, DO  11/29/2014, 1:49 PM  Please contact Palliative Medicine Team phone at [phone] for questions and concerns.

## 2014-11-29 NOTE — Progress Notes (Signed)
Patient ID: [name], female   DOB: 06-17-1928, 81 y.o.   MRN: [id number] Per d/w pt's sister she states thoracentesis on pt has been cancelled following d/w Dr. Phillips OdorGolding. Please call [phone] if any plans change.

## 2014-11-30 LAB — LEGIONELLA PNEUMOPHILA SEROGP 1 UR AG: L. PNEUMOPHILA SEROGP 1 UR AG: NEGATIVE

## 2014-11-30 LAB — MRSA CULTURE

## 2014-12-01 LAB — URINE CULTURE: Culture: 70000

## 2014-12-02 NOTE — Progress Notes (Signed)
Pt family called Sheralyn Boatmanoni to in [place] status, Writer made CG aware that pt was showing some signs of decline in breathing. CG Sheralyn Boatmanoni asked that if she should pass to contact [place] and Pt sister Kathie RhodesBetty.

## 2014-12-02 NOTE — Clinical Documentation Improvement (Signed)
Hospitalist  Can the diagnosis of CHF be further specified?    Acuity - Acute, Chronic, Acute on Chronic   Type - Systolic, Diastolic, Systolic and Diastolic  Other  Clinically Undetermined  Document any associated diagnoses/conditions  Supporting Information: Patient with diastolic dysfunction with heart failure per 11/27 progress notes. Unsure what the cause is for the pleural effusion; CHF is a possibility; also has pitting edema per 11/27 progress notes.  11/27:  BNP: 262.2.   Please exercise your independent, professional judgment when responding. A specific answer is not anticipated or expected.   Thank Sabino DonovanYou,  Tiwanna Tuch [facility] [employer] [facility]  [phone]

## 2014-12-02 NOTE — Progress Notes (Signed)
CSW spoke with [place] with [facility] who plans to meet with family at 11am today- anticipate transfer to their residential hospice facilty this evening.   Reece LevyJanet Sharanya Templin, MSW, Theresia MajorsLCSWA  [phone]

## 2014-12-02 NOTE — Progress Notes (Signed)
Contact made with provider, Shorr to report patient drop in O2 saturation from the 50-72, Pt was getting 20 L/min via nasal cannula high oxygen concentration. Pt also had SBP in the 70's, and evident terminal secretions. Respiratory increased flow to 50l/min , and patient oxygen rose to 87-92% range. Instructed to give dilaudid and atropine to provide more comfort in breathing.

## 2014-12-02 NOTE — Care Management Note (Signed)
Case Management Note  Patient Details  Name: Atilano MedianMarjorie A Bora MRN: [id number] Date of Birth: 07-Jul-1928  Subjective/Objective:    Admitted with resp failure and hypoxia                 Action/Plan:  [facility] and plan to return there   Expected Discharge Date:                  Expected Discharge Plan:  Assisted Living / Rest Home  In-House Referral:  Clinical Social Work  Discharge planning Services  CM Consult  Post Acute Care Choice:    Choice offered to:     DME Arranged:    DME Agency:     HH Arranged:    HH Agency:     Status of Service:  In process, will continue to follow  Medicare Important Message Given:    Date Medicare IM Given:    Medicare IM give by:    Date Additional Medicare IM Given:    Additional Medicare Important Message give by:     If discussed at Long Length of Stay Meetings, dates discussed:    Additional CommentsGeni Bers:  Brison Fiumara, RN 11/28/2014, 4:03 PM

## 2014-12-02 NOTE — Discharge Summary (Addendum)
Death Summary  Atilano MedianMarjorie A Winker ZOX:[id number] DOB: 06-19-1928 DOA: 08/22/2014  PCP: Paulino RilyJONES,ENRICO G, MD   Admit date: 08/22/2014 Date of Death: 11/17/2014  Final Diagnoses:  Principal Problem:   Respiratory failure with hypoxia and hypercapnia (HCC) Active Problems:   S/P colostomy (HCC)   Edema   Atrial fibrillation (HCC)   Dementia without behavioral disturbance   Diastolic dysfunction with heart failure (HCC)   Pleural effusion, right     History of present illness:  Ms. [name] is a 81 year old female with past medical history significant for HTN, s/p colostomy, A. Fib, and not oxygen dependent [facility] breath . She was found to be in acute respiratory failure with hypoxia and hypercapnia.   Hospital Course:  1. Acute hypoxic and hypercapnic respiratory failure with pneumonia/ sepsis from health care associated pneumonia/ acute on chronic diastolic heart failure.  .  Family at bedside and very understanding.  She was initially starte don IV antibiotics and IR consulted for thoracentesis.  Palliative care consulted and family decided for full comfort measures.  Antibiotics and procedures discontinued. Appreciate palliative care team recommendations.  She passed away in 24 hours after initiation of comfort measures.     Time: 4:20 pm  Signed:  Marylon Verno  [facility] 11/16/2014, 5:09 PM

## 2014-12-02 NOTE — Progress Notes (Signed)
Entered pts room at 1615, pt was not breathing, verified TOD with Catie Lendell CapriceSullivan, RN at [phone]. MD Blake DivineAkula notified, [facility] notified- pt not a candidate. Sister Kathie RhodesBetty notified. Lenox PondsJerry L Ezzard Ditmer, RN

## 2014-12-02 DEATH — deceased

## 2014-12-03 LAB — CULTURE, BLOOD (ROUTINE X 2)
Culture: NO GROWTH
Culture: NO GROWTH

## 2014-12-21 ENCOUNTER — Encounter: Payer: Medicare Other | Admitting: Cardiology

## 2016-10-23 IMAGING — CR DG CHEST 1V PORT
1 series · 1 of 1 positions shown · non-contrast
Comparison: 08/06/2014 chest radiograph.

CLINICAL DATA: Atrial fibrillation. Transient chest pain. Chest
pain for 1 day. RIGHT-sided rib pain. Fractured RIGHT ribs.

EXAM:
PORTABLE CHEST 1 VIEW

[AP]
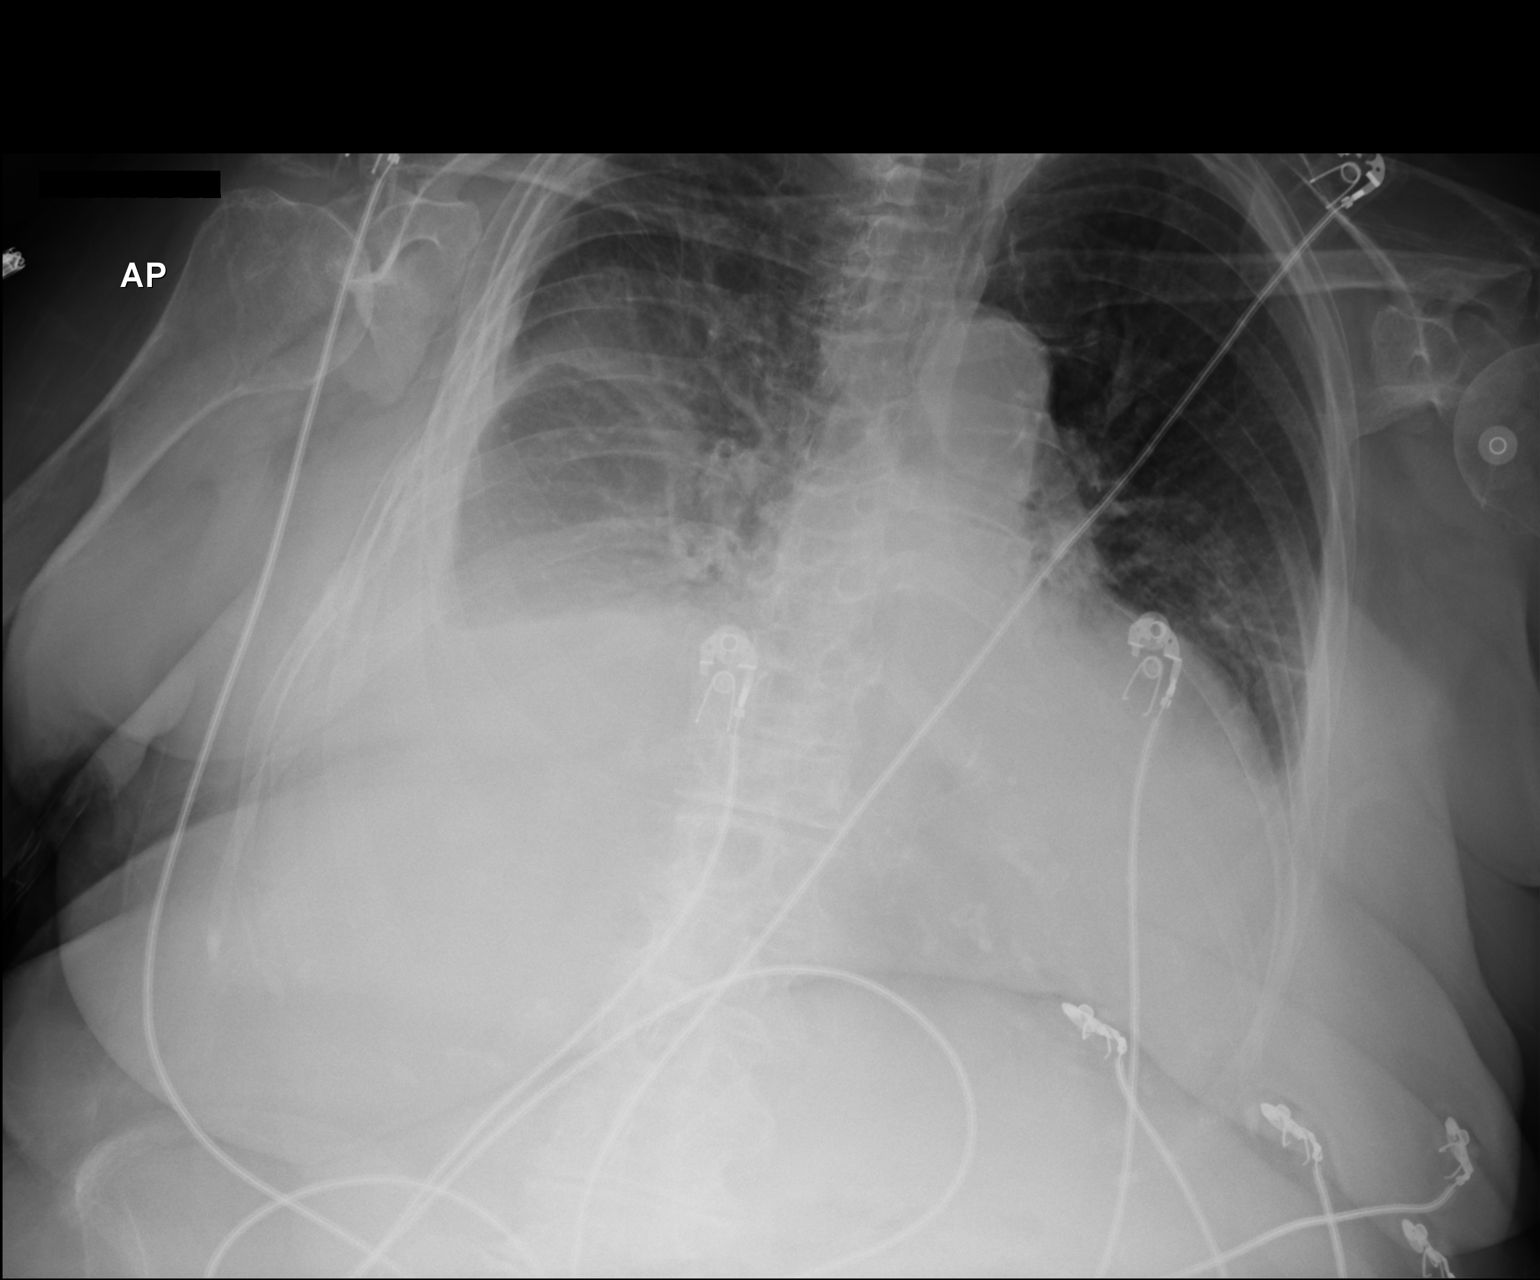

[1 of 1 positions shown; findings below may reference images not displayed]

FINDINGS: Small to moderate RIGHT pleural effusion or hemothorax is present.
Associated atelectasis. Superimposed consolidation cannot be
excluded. There also appears to be a LEFT pleural effusion with
atelectasis in the retrocardiac region. Monitoring leads project
over the chest. Some interval healing of RIGHT-sided rib fractures.
Cardiopericardial silhouette is probably within normal limits
allowing for projection.
IMPRESSION: 1. Small to moderate RIGHT pleural effusion. Probable small LEFT
pleural effusion.
2. RIGHT-greater-than-LEFT basilar atelectasis. Superimposed
consolidation cannot be excluded.
3. Healing RIGHT-sided rib fractures.
